# Patient Record
Sex: Male | Born: 1945 | Race: White | Hispanic: No | Marital: Married | Smoking: Never smoker
Health system: Southern US, Community
[De-identification: ages and names within clinical notes are randomized; demographics above are authoritative.]

## PROBLEM LIST (undated history)

## (undated) DIAGNOSIS — G40909 Epilepsy, unspecified, not intractable, without status epilepticus: Secondary | ICD-10-CM

## (undated) DIAGNOSIS — F319 Bipolar disorder, unspecified: Secondary | ICD-10-CM

## (undated) DIAGNOSIS — J4489 Other specified chronic obstructive pulmonary disease: Secondary | ICD-10-CM

## (undated) DIAGNOSIS — F259 Schizoaffective disorder, unspecified: Secondary | ICD-10-CM

## (undated) DIAGNOSIS — R892 Abnormal level of other drugs, medicaments and biological substances in specimens from other organs, systems and tissues: Secondary | ICD-10-CM

## (undated) DIAGNOSIS — J449 Chronic obstructive pulmonary disease, unspecified: Secondary | ICD-10-CM

## (undated) DIAGNOSIS — I80209 Phlebitis and thrombophlebitis of unspecified deep vessels of unspecified lower extremity: Secondary | ICD-10-CM

## (undated) DIAGNOSIS — Q820 Hereditary lymphedema: Secondary | ICD-10-CM

## (undated) DIAGNOSIS — M6281 Muscle weakness (generalized): Secondary | ICD-10-CM

## (undated) DIAGNOSIS — I10 Essential (primary) hypertension: Secondary | ICD-10-CM

## (undated) DIAGNOSIS — E039 Hypothyroidism, unspecified: Secondary | ICD-10-CM

## (undated) HISTORY — DX: Epilepsy, unspecified, not intractable, without status epilepticus: G40.909

## (undated) HISTORY — DX: Essential (primary) hypertension: I10

## (undated) HISTORY — PX: OTHER SURGICAL HISTORY: SHX169

## (undated) HISTORY — DX: Phlebitis and thrombophlebitis of unspecified deep vessels of unspecified lower extremity: I80.209

## (undated) HISTORY — DX: Hereditary lymphedema: Q82.0

## (undated) HISTORY — DX: Hypothyroidism, unspecified: E03.9

## (undated) HISTORY — DX: Bipolar disorder, unspecified: F31.9

## (undated) HISTORY — DX: Schizoaffective disorder, unspecified: F25.9

## (undated) HISTORY — DX: Abnormal level of other drugs, medicaments and biological substances in specimens from other organs, systems and tissues: R89.2

## (undated) HISTORY — DX: Muscle weakness (generalized): M62.81

## (undated) HISTORY — DX: Chronic obstructive pulmonary disease, unspecified: J44.9

## (undated) HISTORY — DX: Other specified chronic obstructive pulmonary disease: J44.89

---

## 2004-09-23 ENCOUNTER — Emergency Department: Payer: Self-pay | Admitting: Emergency Medicine

## 2004-09-23 ENCOUNTER — Other Ambulatory Visit: Payer: Self-pay

## 2004-09-30 ENCOUNTER — Inpatient Hospital Stay: Payer: Self-pay | Admitting: Anesthesiology

## 2007-08-12 ENCOUNTER — Emergency Department: Payer: Self-pay | Admitting: Emergency Medicine

## 2008-02-17 ENCOUNTER — Ambulatory Visit: Payer: Self-pay | Admitting: Family Medicine

## 2009-05-30 ENCOUNTER — Inpatient Hospital Stay: Payer: Self-pay | Admitting: Internal Medicine

## 2009-10-09 ENCOUNTER — Ambulatory Visit: Payer: Self-pay | Admitting: Family Medicine

## 2010-01-13 ENCOUNTER — Ambulatory Visit: Payer: Self-pay | Admitting: Family Medicine

## 2010-02-08 ENCOUNTER — Ambulatory Visit: Payer: Self-pay | Admitting: Family Medicine

## 2010-04-24 ENCOUNTER — Inpatient Hospital Stay: Payer: Self-pay | Admitting: Internal Medicine

## 2010-04-26 LAB — PSA

## 2010-07-12 ENCOUNTER — Emergency Department (HOSPITAL_COMMUNITY): Admission: EM | Admit: 2010-07-12 | Discharge: 2010-07-12 | Payer: Self-pay | Admitting: Emergency Medicine

## 2011-01-03 ENCOUNTER — Emergency Department (HOSPITAL_COMMUNITY): Payer: Medicare Other

## 2011-01-03 ENCOUNTER — Emergency Department (HOSPITAL_COMMUNITY)
Admission: EM | Admit: 2011-01-03 | Discharge: 2011-01-03 | Disposition: A | Discharge status: Home / Self Care | Payer: Medicare Other | Attending: Emergency Medicine | Admitting: Emergency Medicine

## 2011-01-03 DIAGNOSIS — S1093XA Contusion of unspecified part of neck, initial encounter: Secondary | ICD-10-CM | POA: Insufficient documentation

## 2011-01-03 DIAGNOSIS — E039 Hypothyroidism, unspecified: Secondary | ICD-10-CM | POA: Insufficient documentation

## 2011-01-03 DIAGNOSIS — M79609 Pain in unspecified limb: Secondary | ICD-10-CM | POA: Insufficient documentation

## 2011-01-03 DIAGNOSIS — Z8659 Personal history of other mental and behavioral disorders: Secondary | ICD-10-CM | POA: Insufficient documentation

## 2011-01-03 DIAGNOSIS — S0003XA Contusion of scalp, initial encounter: Secondary | ICD-10-CM | POA: Insufficient documentation

## 2011-01-03 DIAGNOSIS — Z7982 Long term (current) use of aspirin: Secondary | ICD-10-CM | POA: Insufficient documentation

## 2011-01-03 DIAGNOSIS — G40909 Epilepsy, unspecified, not intractable, without status epilepticus: Secondary | ICD-10-CM | POA: Insufficient documentation

## 2011-01-03 DIAGNOSIS — R609 Edema, unspecified: Secondary | ICD-10-CM | POA: Insufficient documentation

## 2011-01-03 DIAGNOSIS — I10 Essential (primary) hypertension: Secondary | ICD-10-CM | POA: Insufficient documentation

## 2011-01-03 DIAGNOSIS — S60229A Contusion of unspecified hand, initial encounter: Secondary | ICD-10-CM | POA: Insufficient documentation

## 2011-01-03 DIAGNOSIS — J45909 Unspecified asthma, uncomplicated: Secondary | ICD-10-CM | POA: Insufficient documentation

## 2011-01-03 DIAGNOSIS — F79 Unspecified intellectual disabilities: Secondary | ICD-10-CM | POA: Insufficient documentation

## 2011-01-03 DIAGNOSIS — F319 Bipolar disorder, unspecified: Secondary | ICD-10-CM | POA: Insufficient documentation

## 2011-01-03 DIAGNOSIS — W06XXXA Fall from bed, initial encounter: Secondary | ICD-10-CM | POA: Insufficient documentation

## 2011-01-03 DIAGNOSIS — Z86718 Personal history of other venous thrombosis and embolism: Secondary | ICD-10-CM | POA: Insufficient documentation

## 2011-01-03 DIAGNOSIS — IMO0002 Reserved for concepts with insufficient information to code with codable children: Secondary | ICD-10-CM | POA: Insufficient documentation

## 2011-01-03 DIAGNOSIS — Z79899 Other long term (current) drug therapy: Secondary | ICD-10-CM | POA: Insufficient documentation

## 2011-01-10 ENCOUNTER — Emergency Department (HOSPITAL_COMMUNITY): Payer: Medicare Other

## 2011-01-10 ENCOUNTER — Emergency Department (HOSPITAL_COMMUNITY)
Admission: EM | Admit: 2011-01-10 | Discharge: 2011-01-10 | Disposition: A | Discharge status: Home / Self Care | Payer: Medicare Other | Attending: Emergency Medicine | Admitting: Emergency Medicine

## 2011-01-10 DIAGNOSIS — F79 Unspecified intellectual disabilities: Secondary | ICD-10-CM | POA: Insufficient documentation

## 2011-01-10 DIAGNOSIS — I10 Essential (primary) hypertension: Secondary | ICD-10-CM | POA: Insufficient documentation

## 2011-01-10 DIAGNOSIS — IMO0002 Reserved for concepts with insufficient information to code with codable children: Secondary | ICD-10-CM | POA: Insufficient documentation

## 2011-01-10 DIAGNOSIS — G40909 Epilepsy, unspecified, not intractable, without status epilepticus: Secondary | ICD-10-CM | POA: Insufficient documentation

## 2011-01-10 DIAGNOSIS — E039 Hypothyroidism, unspecified: Secondary | ICD-10-CM | POA: Insufficient documentation

## 2011-01-10 DIAGNOSIS — W19XXXA Unspecified fall, initial encounter: Secondary | ICD-10-CM | POA: Insufficient documentation

## 2011-01-10 DIAGNOSIS — I890 Lymphedema, not elsewhere classified: Secondary | ICD-10-CM | POA: Insufficient documentation

## 2011-01-10 DIAGNOSIS — S0100XA Unspecified open wound of scalp, initial encounter: Secondary | ICD-10-CM | POA: Insufficient documentation

## 2011-01-10 DIAGNOSIS — J45909 Unspecified asthma, uncomplicated: Secondary | ICD-10-CM | POA: Insufficient documentation

## 2011-01-10 DIAGNOSIS — Y9210 Unspecified residential institution as the place of occurrence of the external cause: Secondary | ICD-10-CM | POA: Insufficient documentation

## 2011-01-10 DIAGNOSIS — Z7982 Long term (current) use of aspirin: Secondary | ICD-10-CM | POA: Insufficient documentation

## 2011-01-10 DIAGNOSIS — S0003XA Contusion of scalp, initial encounter: Secondary | ICD-10-CM | POA: Insufficient documentation

## 2011-01-10 DIAGNOSIS — Z8659 Personal history of other mental and behavioral disorders: Secondary | ICD-10-CM | POA: Insufficient documentation

## 2011-01-10 DIAGNOSIS — S1093XA Contusion of unspecified part of neck, initial encounter: Secondary | ICD-10-CM | POA: Insufficient documentation

## 2011-01-10 DIAGNOSIS — Z79899 Other long term (current) drug therapy: Secondary | ICD-10-CM | POA: Insufficient documentation

## 2011-01-10 DIAGNOSIS — F319 Bipolar disorder, unspecified: Secondary | ICD-10-CM | POA: Insufficient documentation

## 2011-01-10 DIAGNOSIS — Z86718 Personal history of other venous thrombosis and embolism: Secondary | ICD-10-CM | POA: Insufficient documentation

## 2011-01-10 LAB — BASIC METABOLIC PANEL
Calcium: 8.7 mg/dL (ref 8.4–10.5)
Chloride: 104 mEq/L (ref 96–112)
Creatinine, Ser: 0.94 mg/dL (ref 0.4–1.5)
GFR calc Af Amer: >60 mL/min (ref >60)
GFR calc non Af Amer: >60 mL/min (ref >60)

## 2011-01-10 LAB — PHENYTOIN LEVEL, TOTAL: Phenytoin Lvl: 29.6 ug/mL — ABNORMAL HIGH (ref 10.0–20.0)

## 2011-01-10 LAB — CBC
MCH: 29.6 pg (ref 26.0–34.0)
Platelets: 315 10*3/uL (ref 150–400)
RBC: 6.11 MIL/uL — ABNORMAL HIGH (ref 4.22–5.81)
RDW: 15 % (ref 11.5–15.5)
WBC: 8.3 10*3/uL (ref 4.0–10.5)

## 2011-01-10 LAB — DIFFERENTIAL
Basophils Relative: 1 % (ref 0–1)
Eosinophils Absolute: 0.4 10*3/uL (ref 0.0–0.7)
Eosinophils Relative: 4 % (ref 0–5)
Monocytes Relative: 5 % (ref 3–12)
Neutrophils Relative %: 70 % (ref 43–77)

## 2011-01-10 LAB — VALPROIC ACID LEVEL: Valproic Acid Lvl: 32.7 ug/mL — ABNORMAL LOW (ref 50.0–100.0)

## 2011-12-15 ENCOUNTER — Encounter: Payer: Self-pay | Admitting: Gastroenterology

## 2012-01-07 ENCOUNTER — Ambulatory Visit: Payer: Medicare Other | Admitting: Gastroenterology

## 2012-01-30 ENCOUNTER — Ambulatory Visit: Payer: Medicare Other | Admitting: Gastroenterology

## 2012-12-02 ENCOUNTER — Other Ambulatory Visit: Payer: Self-pay | Admitting: Internal Medicine

## 2012-12-02 DIAGNOSIS — R479 Unspecified speech disturbances: Secondary | ICD-10-CM

## 2012-12-03 ENCOUNTER — Ambulatory Visit (HOSPITAL_COMMUNITY)
Admission: RE | Admit: 2012-12-03 | Discharge: 2012-12-03 | Disposition: A | Discharge status: Home / Self Care | Payer: Medicare Other | Source: Ambulatory Visit | Attending: Internal Medicine | Admitting: Internal Medicine

## 2012-12-03 DIAGNOSIS — R479 Unspecified speech disturbances: Secondary | ICD-10-CM

## 2012-12-03 DIAGNOSIS — R4789 Other speech disturbances: Secondary | ICD-10-CM | POA: Insufficient documentation

## 2012-12-03 DIAGNOSIS — I6789 Other cerebrovascular disease: Secondary | ICD-10-CM | POA: Insufficient documentation

## 2013-01-07 ENCOUNTER — Non-Acute Institutional Stay: Payer: Medicare Other | Admitting: Internal Medicine

## 2013-01-07 DIAGNOSIS — I890 Lymphedema, not elsewhere classified: Secondary | ICD-10-CM

## 2013-01-07 DIAGNOSIS — F209 Schizophrenia, unspecified: Secondary | ICD-10-CM

## 2013-01-07 DIAGNOSIS — E039 Hypothyroidism, unspecified: Secondary | ICD-10-CM

## 2013-01-07 DIAGNOSIS — R569 Unspecified convulsions: Secondary | ICD-10-CM

## 2013-01-27 NOTE — Progress Notes (Signed)
Patient ID: Kristopher Pitts, male   DOB: August 01, 1946, 67 y.o.   MRN: [id number]        PROGRESS NOTE  DATE:  01/07/2013  FACILITY: [facility]   LEVEL OF CARE: Assisted Living   Routine Visit  CHIEF COMPLAINT:  Manage lymphedema, hypothyroidism and seizure disorder.    HISTORY OF PRESENT ILLNESS:  REASSESSMENT OF ONGOING PROBLEM(S):  CHRONIC LYMPHEDEMA:  The patient has chronic lymphedema of bilateral lower extremities, left greater than right, which is hereditary edema.  He is overall a poor historian.  Staff do not report increasing lower extremity swelling.    HYPOTHYROIDISM: The hypothyroidism remains stable. No complications noted from the medications presently being used.  The staff denies fatigue or constipation.  Last TSH:  1.91 in 10/2012.    SEIZURE DISORDER: The patient's seizure disorder remains stable. No complications reported from the medications presently being used. Staff do not report any recent seizure activity.   PAST MEDICAL HISTORY : Reviewed.  No changes.  CURRENT MEDICATIONS: Reviewed per [facility]  REVIEW OF SYSTEMS:  Difficult to obtain secondary to mental retardation.    PHYSICAL EXAMINATION  VS:  T 98.1      P  80    RR  20    BP 138/64    POX %     WT (Lb) 232  GENERAL: no acute distress, moderately obese body habitus EYES: conjunctivae normal, sclerae normal, normal eye lids NECK: supple, trachea midline, no neck masses, no thyroid tenderness, no thyromegaly LYMPHATICS: no LAN in the neck, no supraclavicular LAN RESPIRATORY: breathing is even & unlabored, BS CTAB CARDIAC: RRR, no murmur,no extra heart sounds EDEMA/VARICOSITIES:  left lower extremity has +6 edema, right lower extremity has +3 edema ARTERIAL:  pedal pulses nonpalpable  GI: abdomen soft, normal BS, no masses, no tenderness, no hepatomegaly, no splenomegaly PSYCHIATRIC: the patient is alert & oriented to person, affect & behavior appropriate  LABS/RADIOLOGY: 12/2012:  CBC normal.    Albumin 3.3.    Dilantin level 7.    11/2012:   TSH 2.2.    10/2012:  Total protein 5.9, albumin 3.4, otherwise CMP normal.    Hemoglobin A1C 5.1.    Depakote level 22.    09/2012:  Fasting lipid panel normal.  ASSESSMENT/PLAN:  Chronic lymphedema.  This is hereditary.  Symptoms are stable.  Due to hereditary edema, muscle weakness and history of falls, he requires a wheelchair.   He has mobility limitations that significantly impair his ability to participate in activities of daily living such as feeding, dressing, grooming and bathing in customary locations in the assisted living facility.  He had expressed to use the wheelchair and has sufficient upper body strength to self-propel the wheelchair.  He would benefit from having a manual wheelchair to perform ADLs.    Hypothyroidism.  Well controlled.     Hypertension.  Well controlled.   Schizophrenia.  Stable.   Seizure disorder.  Well controlled.   BPH.  No issues reported by the staff.    Left lower extremity DVT.  Patient was started on Xarelto.    Hypokalemia.  Well repleted.  Continue supplementation.     CPT CODE: 44034

## 2013-04-06 ENCOUNTER — Non-Acute Institutional Stay: Payer: Medicare Other | Admitting: Internal Medicine

## 2013-04-06 DIAGNOSIS — F209 Schizophrenia, unspecified: Secondary | ICD-10-CM

## 2013-04-06 DIAGNOSIS — E039 Hypothyroidism, unspecified: Secondary | ICD-10-CM

## 2013-04-06 DIAGNOSIS — I10 Essential (primary) hypertension: Secondary | ICD-10-CM

## 2013-04-06 DIAGNOSIS — I890 Lymphedema, not elsewhere classified: Secondary | ICD-10-CM

## 2013-04-08 DIAGNOSIS — F209 Schizophrenia, unspecified: Secondary | ICD-10-CM | POA: Insufficient documentation

## 2013-04-08 DIAGNOSIS — I890 Lymphedema, not elsewhere classified: Secondary | ICD-10-CM | POA: Insufficient documentation

## 2013-04-08 DIAGNOSIS — I10 Essential (primary) hypertension: Secondary | ICD-10-CM | POA: Insufficient documentation

## 2013-04-08 DIAGNOSIS — E039 Hypothyroidism, unspecified: Secondary | ICD-10-CM | POA: Insufficient documentation

## 2013-04-08 NOTE — Progress Notes (Signed)
Patient ID: Kristopher Pitts, male   DOB: 14-May-1946, 67 y.o.   MRN: [id number]        PROGRESS NOTE  DATE:  04/06/2013  FACILITY: [facility]   LEVEL OF CARE: Assisted Living   Routine Visit  CHIEF COMPLAINT:  Manage lymphedema, hypothyroidism and seizure disorder.    HISTORY OF PRESENT ILLNESS:  REASSESSMENT OF ONGOING PROBLEM(S):  CHRONIC LYMPHEDEMA:  The patient has chronic lymphedema of bilateral lower extremities, left greater than right, which is hereditary edema.  He is overall a poor historian.  Staff do not report increasing lower extremity swelling.    HYPOTHYROIDISM: The hypothyroidism remains stable. No complications noted from the medications presently being used.  The staff denies fatigue or constipation.  Last TSH:  1.91 in 10/2012.    SEIZURE DISORDER: The patient's seizure disorder remains stable. No complications reported from the medications presently being used. Staff do not report any recent seizure activity.   PAST MEDICAL HISTORY : Reviewed.  No changes.  CURRENT MEDICATIONS: Reviewed per [facility]  REVIEW OF SYSTEMS:  Difficult to obtain secondary to mental retardation.    PHYSICAL EXAMINATION  VS:  T 98.4      P  82    RR  20    BP 108/66    POX %     WT (Lb) 240  GENERAL: no acute distress, moderately obese body habitus EYES: conjunctivae normal, sclerae normal, normal eye lids NECK: supple, trachea midline, no neck masses, no thyroid tenderness, no thyromegaly LYMPHATICS: no LAN in the neck, no supraclavicular LAN RESPIRATORY: breathing is even & unlabored, BS CTAB CARDIAC: RRR, no murmur,no extra heart sounds EDEMA/VARICOSITIES:  left lower extremity has +6 edema, right lower extremity has +2 edema ARTERIAL:  pedal pulses nonpalpable  GI: abdomen soft, normal BS, no masses, no tenderness, no hepatomegaly, no splenomegaly PSYCHIATRIC: the patient is alert & oriented to person, affect & behavior appropriate  LABS/RADIOLOGY:  6/14 alb 3.4, dilantin level  9.4  12/2012:  CBC normal.   Albumin 3.3.    Dilantin level 7.    11/2012:   TSH 2.2.    10/2012:  Total protein 5.9, albumin 3.4, otherwise CMP normal.    Hemoglobin A1C 5.1.    Depakote level 22.    09/2012:  Fasting lipid panel normal.  ASSESSMENT/PLAN:  Chronic lymphedema.  This is hereditary.  Symptoms are stable.    Hypothyroidism.  Well controlled.     Hypertension.  Well controlled.   Schizophrenia.  Stable.   Seizure disorder.  Well controlled.   BPH.  No issues reported by the staff.    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    Hypokalemia.  Well repleted.  Continue supplementation.    CPT CODE: 40981

## 2013-04-26 ENCOUNTER — Other Ambulatory Visit: Payer: Self-pay | Admitting: Geriatric Medicine

## 2013-04-26 MED ORDER — CLONAZEPAM 0.5 MG PO TABS: 0.5000 mg | ORAL_TABLET | Freq: Three times a day (TID) | ORAL | Status: DC | PRN

## 2013-06-03 ENCOUNTER — Non-Acute Institutional Stay: Payer: Medicare Other | Admitting: Internal Medicine

## 2013-06-03 DIAGNOSIS — H109 Unspecified conjunctivitis: Secondary | ICD-10-CM

## 2013-06-21 ENCOUNTER — Other Ambulatory Visit: Payer: Self-pay

## 2013-06-21 MED ORDER — AMBULATORY NON FORMULARY MEDICATION: Status: AC

## 2013-06-21 NOTE — Telephone Encounter (Signed)
I called the pharmacy [facility] to see how medication is dispense- per Fleet Contras rx should be written to be dispensed as syringes ie-30 syringes with allowed refills   Verified dose and instructions reflect manual request received by nursing home.

## 2013-06-29 DIAGNOSIS — H109 Unspecified conjunctivitis: Secondary | ICD-10-CM | POA: Insufficient documentation

## 2013-06-29 NOTE — Progress Notes (Signed)
Patient ID: Kristopher Pitts, male   DOB: [birth year], 67 y.o.   MRN: [id number]        PROGRESS NOTE  DATE: 06/03/2013  FACILITY:  [facility]  LEVEL OF CARE: ALF (13)  Acute Visit  CHIEF COMPLAINT:  Manage bilateral eye redness and discharge.    HISTORY OF PRESENT ILLNESS: I was requested by the staff to assess the patient regarding above problem(s):  Staff report that patient is having bilateral eye redness and drainage for several days.  Patient overall is a poor historian.  Staff cannot identify precipitating or alleviating factors.  There is no temporal relationship.    PAST MEDICAL HISTORY : Reviewed.  No changes.  CURRENT MEDICATIONS: Reviewed per [facility]  REVIEW OF SYSTEMS:  Unobtainable due to patient being a poor historian.    PHYSICAL EXAMINATION  VS:  T 98.1       P 64      RR 20      BP 130/72     POX %       WT (Lb)  GENERAL: no acute distress, morbidly obese body habitus EYES: right eye has extreme conjunctival erythema and a yellow discharge, left eye also has symptoms but to a lesser degree NECK: supple, trachea midline, no neck masses, no thyroid tenderness, no thyromegaly LYMPHATICS: no LAN in the neck, no supraclavicular LAN RESPIRATORY: breathing is even & unlabored, BS CTAB  ASSESSMENT/PLAN:  Bilateral conjunctivitis.  New problem.  Start gentamicin opthalmic 0.3%, 2 drops to each eye b.i.d. for one week.    CPT CODE: 40981

## 2013-08-11 ENCOUNTER — Non-Acute Institutional Stay: Payer: Medicare Other | Admitting: Internal Medicine

## 2013-08-11 DIAGNOSIS — J189 Pneumonia, unspecified organism: Secondary | ICD-10-CM

## 2013-08-18 ENCOUNTER — Encounter: Payer: Self-pay | Admitting: Internal Medicine

## 2013-08-18 ENCOUNTER — Non-Acute Institutional Stay: Payer: Medicare Other | Admitting: Internal Medicine

## 2013-08-18 DIAGNOSIS — I10 Essential (primary) hypertension: Secondary | ICD-10-CM

## 2013-08-18 DIAGNOSIS — R569 Unspecified convulsions: Secondary | ICD-10-CM

## 2013-08-18 DIAGNOSIS — I890 Lymphedema, not elsewhere classified: Secondary | ICD-10-CM

## 2013-08-18 DIAGNOSIS — E039 Hypothyroidism, unspecified: Secondary | ICD-10-CM

## 2013-08-18 NOTE — Progress Notes (Signed)
Patient ID: Kristopher Pitts, male   DOB: 1946-01-21, 67 y.o.   MRN: [id number]        PROGRESS NOTE  DATE:  08/18/2013  FACILITY: [facility]   LEVEL OF CARE: Assisted Living   Routine Visit  CHIEF COMPLAINT:  Manage lymphedema, hypothyroidism and seizure disorder.    HISTORY OF PRESENT ILLNESS:  REASSESSMENT OF ONGOING PROBLEM(S):  CHRONIC LYMPHEDEMA:  The patient has chronic lymphedema of bilateral lower extremities, left greater than right, which is hereditary edema.  He is overall a poor historian.  Staff do not report increasing lower extremity swelling.    HYPOTHYROIDISM: The hypothyroidism remains stable. No complications noted from the medications presently being used.  The staff denies fatigue or constipation.  Last TSH:  1.91 in 10/2012, in 8-14 TSH 1.1.    SEIZURE DISORDER: The patient's seizure disorder remains stable. No complications reported from the medications presently being used. Staff do not report any recent seizure activity.   PAST MEDICAL HISTORY : Reviewed.  No changes.  CURRENT MEDICATIONS: Reviewed per [facility]  REVIEW OF SYSTEMS:  Difficult to obtain secondary to mental retardation.    PHYSICAL EXAMINATION  VS:  T 97.3      P  94    RR  20    BP 128/94    POX %     WT (Lb) 232  GENERAL: no acute distress, moderately obese body habitus EYES: conjunctivae normal, sclerae normal, normal eye lids NECK: supple, trachea midline, no neck masses, no thyroid tenderness, no thyromegaly LYMPHATICS: no LAN in the neck, no supraclavicular LAN RESPIRATORY: breathing is even & unlabored, BS CTAB CARDIAC: RRR, no murmur,no extra heart sounds EDEMA/VARICOSITIES:  left lower extremity has +6 edema, right lower extremity has +2 edema ARTERIAL:  pedal pulses nonpalpable  GI: abdomen soft, normal BS, no masses, no tenderness, no hepatomegaly, no splenomegaly PSYCHIATRIC: the patient is alert & oriented to person, affect & behavior appropriate  LABS/RADIOLOGY:  11-14 CBC  normal 8-14 total protein 5.7, albumin 3.1 otherwise CMP normal, Depakote level 63, Dilantin level 7.6 7-14 HDL 34 otherwise fasting lipid panel normal  6/14 alb 3.4, dilantin level 9.4  12/2012:  CBC normal.   Albumin 3.3.    Dilantin level 7.    11/2012:   TSH 2.2.    10/2012:  Total protein 5.9, albumin 3.4, otherwise CMP normal.    Hemoglobin A1C 5.1.    Depakote level 22.    09/2012:  Fasting lipid panel normal.  ASSESSMENT/PLAN:  Chronic lymphedema.  This is hereditary.  Symptoms are stable.    Hypothyroidism.  Well controlled.     Hypertension.  Well controlled.   Schizophrenia.  Stable.   Seizure disorder.  Well controlled.   BPH.  No issues reported by the staff.    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    Hypokalemia.  Well repleted.  Continue supplementation.    Pneumonia-completed augmentin.  CPT CODE: 14782

## 2013-09-23 ENCOUNTER — Other Ambulatory Visit (HOSPITAL_COMMUNITY): Payer: Self-pay | Admitting: Internal Medicine

## 2013-09-23 DIAGNOSIS — R1310 Dysphagia, unspecified: Secondary | ICD-10-CM

## 2013-09-26 ENCOUNTER — Encounter: Payer: Self-pay | Admitting: Internal Medicine

## 2013-09-26 DIAGNOSIS — J189 Pneumonia, unspecified organism: Secondary | ICD-10-CM | POA: Insufficient documentation

## 2013-09-26 NOTE — Progress Notes (Addendum)
Patient ID: Kristopher Pitts, male   DOB: 1946/04/06, 67 y.o.   MRN: [id number]        PROGRESS NOTE  DATE: 08/11/2013    FACILITY:  [facility]  LEVEL OF CARE: ALF (13)  Acute Visit  CHIEF COMPLAINT:  Manage pneumonia.    HISTORY OF PRESENT ILLNESS: I was requested by the staff to assess the patient regarding above problem(s):  Due to coughing, patient had a chest x-ray done on 08/08/2013 which showed acute right base infiltrate.  Patient is a poor historian.  Staff do not report respiratory insufficiency or fever.    PAST MEDICAL HISTORY : Reviewed.  No changes.  CURRENT MEDICATIONS: Reviewed per [facility]  REVIEW OF SYSTEMS:  Unobtainable.  Patient is a poor historian.      PHYSICAL EXAMINATION  GENERAL: no acute distress, morbidly obese body habitus EYES: conjunctivae normal, sclerae normal, normal eye lids NECK: supple, trachea midline, no neck masses, no thyroid tenderness, no thyromegaly LYMPHATICS: no LAN in the neck, no supraclavicular LAN RESPIRATORY: breathing is even & unlabored, BS CTAB CARDIAC: RRR, no murmur,no extra heart sounds EDEMA/VARICOSITIES: left lower extremity has +4 edema, right lower extremity has +2 edema   ARTERIAL: pedal pulses nonpalpable   GI: abdomen soft, normal BS, no masses, no tenderness, no hepatomegaly, no splenomegaly PSYCHIATRIC: the patient is alert, disoriented, affect & behavior appropriate  ASSESSMENT/PLAN:  Right basilar pneumonia.  New onset.  Significant problem.   Augmentin 500 mg b.i.d. for seven days and probiotics b.i.d. for 10 days started.    THN Metrics:   Currently not on aspirin due to being on Xarelto.  Current non smoker.   CPT CODE: 08657

## 2013-09-27 ENCOUNTER — Ambulatory Visit (HOSPITAL_COMMUNITY)
Admission: RE | Admit: 2013-09-27 | Discharge: 2013-09-27 | Disposition: A | Discharge status: Home / Self Care | Payer: Medicare Other | Source: Ambulatory Visit | Attending: Internal Medicine | Admitting: Internal Medicine

## 2013-09-27 ENCOUNTER — Ambulatory Visit (HOSPITAL_COMMUNITY): Payer: Medicare Other

## 2013-09-27 DIAGNOSIS — R1310 Dysphagia, unspecified: Secondary | ICD-10-CM

## 2013-09-27 NOTE — Procedures (Signed)
Objective Swallowing Evaluation: Modified Barium Swallowing Study  Patient Details  Name: Kristopher Pitts MRN: [id number] Date of Birth: 08/19/1946  Today's Date: 09/27/2013 Time: 1330-1401 SLP Time Calculation (min): 31 min  Past Medical History:  Past Medical History  Diagnosis Date  . Unspecified essential hypertension   . Unspecified hypothyroidism   . Unspecified epilepsy without mention of intractable epilepsy   . Bipolar disorder, unspecified   . Schizoaffective disorder, unspecified condition   . Chronic airway obstruction, not elsewhere classified   . Nonspecific abnormal toxicological findings   . Muscle weakness (generalized)   . Hereditary edema of legs   . Deep vein thrombophlebitis of leg, unspecified laterality   . Asthma    Past Surgical History: No past surgical history on file. HPI:  67 yo male referred by SNF SLP for MBSS.  Pt PMH + for Mental Retardation, DVT, HTN, seizures, Asthma/COPD, Hypothyroidism, lymphadema and Dysphagia.  Per SNF SLP, pt has been coughing with intake, use of sippy cup better tolerated.  Pna reported approx six weeks ago but pt has maintained his weight.   Medication list includes Lasix, Topomax, Flomax, Xarelto, Cogentin, Synthroid, Depakote, Dilantin, Iloperidone and Prolixin.  Pt required use of lift for transfer during MBS and is wheelchair bound.       Assessment / Plan / Recommendation Clinical Impression  Dysphagia Diagnosis: Moderate oral phase dysphagia;Moderate pharyngeal phase dysphagia  Clinical impression: Limited evaluation due to pt's broad shoulders impacting view.  Moderate oropharyngeal dysphagia with both sensorimotor deficits apparent.   Pt noted to drool prior to po administration, suspect due to ? CN deficit, accessory muscle nerve impact (decreased ability to hold head up) and curvature of spine.    Impaired oral bolus cohesion/coordination present resulting in delayed oral transiting and premature spillage of barium  into pharynx as well as mild oral residuals.  Pharyngeal swallow was intermittently delayed with decreased laryngeal elevation/closure allowing penetration/aspiration of liquids.   Pharyngeal swallow was strong with only trace residuals but unfortunately pt with laryngeal penetration/aspiration of trace amounts of stasis/secretions.    Pt did not follow commands consistently to dry swallow or cough, which will raise his aspiration risk.   Use of pt's sippy cup was limited (but used throughout testing) due to large size of cup and limited amount of barium able to use and decreased ability for pt to lift cup adequately.  Trace aspiration of thin noted during the swallow, as well as laryngeal penetration/aspiration of nectar mixed with secretion after the swallow.  Defer to primary SLP to determine liquid consistency, ? if pt could produce occasional strong cough and dry swallow on cue and all vitals stable/pt tolerates po diet, continuing thin liquids may be beneficial.  Skilled intervention included educating pt to precautions and reinforcing beneficial strategies.  Please note, pt coughed x3 during MBS, SLP could not attribute to aspiration, unless was a delayed response.   Also question if appearance of delayed clearance of esophagus contributes to pt's dysphagia symptoms.     Thanks for this consult.      Treatment Recommendation  Defer treatment plan to SLP at (Comment) (SNF)    Diet Recommendation Dysphagia 3 (Mechanical Soft);Thin liquid;Nectar-thick liquid (thin vs nectar, defer to SNF SLP/MD)   Medication Administration: Whole meds with puree Supervision: Full supervision/cueing for compensatory strategies Compensations: Hard cough after swallow;Multiple dry swallows after each bite/sip;Slow rate;Small sips/bites (encourage intermittent dry swallow and cough) Postural Changes and/or Swallow Maneuvers: Upright 30-60 min after meal;Seated upright 90 degrees  Other  Recommendations Oral Care  Recommendations: Oral care BID              General Date of Onset: 09/27/13 HPI: 67 yo male referred by SNF SLP for MBSS.  Pt PMH + for Mental Retardation, DVT, HTN, seizures, Asthma/COPD, Hypothyroidism, lymphadema and Dysphagia.  Per SNF SLP, pt has been coughing with intake, use of sippy cup better tolerated.  Pna reported approx six weeks ago but pt has maintained his weight.   Medication list includes Lasix, Topomax, Flomax, Xarelto, Cogentin, Synthroid, Depakote, Dilantin, Iloperidone and Prolixin.  Pt required use of lift for transfer during MBS and is wheelchair bound.   Type of Study: Modified Barium Swallowing Study Reason for Referral: Objectively evaluate swallowing function Previous Swallow Assessment: cervical spine 2011  There is significant disc height loss at C5-6, with anterior bridging, and partial ankylosis of C5-C6.  There is also disc height narrowing at C3-4, C4-5, and C6-7. Diet Prior to this Study: Dysphagia 3 (soft);Thin liquids (pt has been using sippy cup) Temperature Spikes Noted: No Respiratory Status: Room air History of Recent Intubation: No Behavior/Cognition: Alert Oral Cavity - Dentition: Missing dentition Oral Motor / Sensory Function:  (limited assessment due to decreased abilitly to follow directions, pt drooling and leaning head down ? accessory nerve involvement exacerbated by kyphosis, dysarthria) Self-Feeding Abilities: Needs assist Patient Positioning: Postural control interferes with function (pt kyphotic impacting swallow, difficult to position ) Baseline Vocal Quality: Clear Volitional Cough: Weak Volitional Swallow: Unable to elicit Anatomy:  (except kyphosis, appearance of cervical osteophyte at  c4-c5 impinging into pharynx) Pharyngeal Secretions: Standing secretions in (comment) (pharynx that mixed with barium)    Reason for Referral Objectively evaluate swallowing function   Oral Phase Oral Preparation/Oral Phase Oral Phase:  Impaired Oral - Nectar Oral - Nectar Teaspoon: Weak lingual manipulation;Delayed oral transit;Reduced posterior propulsion Oral - Nectar Cup: Weak lingual manipulation;Delayed oral transit;Reduced posterior propulsion Oral - Thin Oral - Thin Teaspoon: Weak lingual manipulation;Delayed oral transit;Reduced posterior propulsion Oral - Thin Cup: Weak lingual manipulation;Delayed oral transit;Reduced posterior propulsion Oral - Solids Oral - Puree: Weak lingual manipulation;Delayed oral transit;Reduced posterior propulsion Oral - Regular: Weak lingual manipulation;Delayed oral transit;Reduced posterior propulsion Oral Phase - Comment Oral Phase - Comment: pt with premature spillage of barium into pharynx with poor control due to weakness, pt did not consistently follow directions to dry swallow, etc, drooling noted with pt having difficulty keeping head neutral during testing - suspect kyphosis contributes   Pharyngeal Phase Pharyngeal Phase Pharyngeal Phase: Impaired Pharyngeal - Nectar Pharyngeal - Nectar Teaspoon: Premature spillage to pyriform sinuses;Delayed swallow initiation;Reduced anterior laryngeal mobility;Reduced laryngeal elevation;Reduced airway/laryngeal closure;Penetration/Aspiration during swallow Penetration/Aspiration details (nectar teaspoon): Material enters airway, remains ABOVE vocal cords then ejected out Pharyngeal - Nectar Cup: Premature spillage to pyriform sinuses;Premature spillage to valleculae;Delayed swallow initiation;Trace aspiration;Penetration/Aspiration during swallow;Reduced airway/laryngeal closure;Reduced laryngeal elevation;Reduced anterior laryngeal mobility Penetration/Aspiration details (nectar cup): Material enters airway, passes BELOW cords without attempt by patient to eject out (silent aspiration) Pharyngeal - Thin Pharyngeal - Thin Teaspoon: Reduced airway/laryngeal closure;Reduced laryngeal elevation;Reduced anterior laryngeal  mobility;Penetration/Aspiration during swallow;Trace aspiration;Premature spillage to pyriform sinuses Penetration/Aspiration details (thin teaspoon): Material enters airway, CONTACTS cords then ejected out Pharyngeal - Thin Cup: Premature spillage to pyriform sinuses;Penetration/Aspiration during swallow;Reduced anterior laryngeal mobility;Reduced laryngeal elevation;Reduced airway/laryngeal closure Penetration/Aspiration details (thin cup): Material enters airway, passes BELOW cords without attempt by patient to eject out (silent aspiration) Pharyngeal - Solids Pharyngeal - Puree: Premature spillage to valleculae Pharyngeal - Regular: Premature  spillage to valleculae Pharyngeal Phase - Comment Pharyngeal Comment: mild pharyngeal stasis noted, decreased laryngeal elevation/closure, barium mixed with oropharyngeal secretions   Cervical Esophageal Phase    GO    Cervical Esophageal Phase Cervical Esophageal Phase: Impaired Cervical Esophageal Phase - Comment Cervical Esophageal Comment: appearance of kyphosis likely contributes to delayed esophageal clearance, pt appeared with stasis across consistencies at distal esophagus without sensation, appearance of backflow of liquids given to aid solid/puree clearance without pt sensation, backflow appeared to mid-esophagus that may be consistent with motility issues, radiologist not present to confirm findings    Functional Assessment Tool Used: MBS, clinical judgement Functional Limitations: Swallowing Swallow Current Status (J4782): At least 20 percent but less than 40 percent impaired, limited or restricted Swallow Goal Status [phone]): At least 20 percent but less than 40 percent impaired, limited or restricted Swallow Discharge Status [id number]): At least 20 percent but less than 40 percent impaired, limited or restricted    Donavan Burnet, MS [facility] S[facility] [phone]

## 2013-10-03 ENCOUNTER — Non-Acute Institutional Stay (SKILLED_NURSING_FACILITY): Payer: Medicare Other | Admitting: Family

## 2013-10-03 DIAGNOSIS — I10 Essential (primary) hypertension: Secondary | ICD-10-CM

## 2013-10-03 DIAGNOSIS — R1310 Dysphagia, unspecified: Secondary | ICD-10-CM

## 2013-10-03 DIAGNOSIS — F259 Schizoaffective disorder, unspecified: Secondary | ICD-10-CM

## 2013-10-03 DIAGNOSIS — E039 Hypothyroidism, unspecified: Secondary | ICD-10-CM

## 2013-10-04 ENCOUNTER — Encounter: Payer: Self-pay | Admitting: Family

## 2013-10-04 NOTE — Progress Notes (Signed)
Patient ID: Kristopher Pitts, male   DOB: 08/30/1946, 68 y.o.   MRN: [id number]  Date: 10/03/13 [facility]: [facility]   Chief Complaint  Patient presents with  . Medical Managment of Chronic Issues    Routine Visit      HPI: Pt is being followed for the medical management of chronic illnesses. Health care team denies concerns/issues at present.       Allergies  Allergen Reactions  . Coumadin [Warfarin Sodium]   . Lithium      Medication List       This list is accurate as of: 10/03/13 11:59 PM.  Always use your most recent med list.               AMBULATORY NON FORMULARY MEDICATION  Medication Name: Lorazepam 1 mg /ML gel, apply 1 ML topically every 8 hours as needed     benztropine 0.5 MG tablet  Commonly known as:  COGENTIN  Take 0.5 mg by mouth daily.     clonazePAM 0.5 MG tablet  Commonly known as:  KLONOPIN  Take 1 tablet (0.5 mg total) by mouth 3 (three) times daily as needed for anxiety.     divalproex 250 MG DR tablet  Commonly known as:  DEPAKOTE  Take 750 mg by mouth 2 (two) times daily.     FANAPT 10 MG Tabs  Generic drug:  Iloperidone  Take 1 tablet by mouth daily.     finasteride 5 MG tablet  Commonly known as:  PROSCAR  Take 5 mg by mouth daily.     fluPHENAZine 10 MG tablet  Commonly known as:  PROLIXIN  Take 10 mg by mouth 2 (two) times daily.     furosemide 20 MG tablet  Commonly known as:  LASIX  Take 20 mg by mouth 3 (three) times daily.     levothyroxine 75 MCG tablet  Commonly known as:  SYNTHROID, LEVOTHROID  Take 37.5 mcg by mouth daily.     olanzapine zydis 15 MG disintegrating tablet  Commonly known as:  ZYPREXA  Take 15 mg by mouth at bedtime.     oxybutynin 5 MG tablet  Commonly known as:  DITROPAN  Take 5 mg by mouth daily.     phenytoin 100 MG ER capsule  Commonly known as:  DILANTIN  Take 100 mg by mouth 2 (two) times daily.     potassium chloride 10 MEQ tablet  Commonly known as:  K-DUR,KLOR-CON  Take 10  mEq by mouth daily.     tamsulosin 0.4 MG Caps capsule  Commonly known as:  FLOMAX  Take 0.4 mg by mouth daily.     topiramate 100 MG tablet  Commonly known as:  TOPAMAX  Take 100 mg by mouth daily.         DATA REVIEWED    Laboratory Studies: Reviewed      Past Medical History  Diagnosis Date  . Unspecified essential hypertension   . Unspecified hypothyroidism   . Unspecified epilepsy without mention of intractable epilepsy   . Bipolar disorder, unspecified   . Schizoaffective disorder, unspecified condition   . Chronic airway obstruction, not elsewhere classified   . Nonspecific abnormal toxicological findings   . Muscle weakness (generalized)   . Hereditary edema of legs   . Deep vein thrombophlebitis of leg, unspecified laterality   . Asthma      Review of Systems  Unable to perform ROS: other  Garbled Speech  Physical Exam Filed Vitals:  10/03/13 1500  BP: 120/74  Pulse: 76  Temp: 97 F (36.1 C)  Resp: 20   There is no height or weight on file to calculate BMI. Physical Exam  Constitutional:  Pt is dressed and groomed appropriated in facility's bed.  Eyes:    Cardiovascular: Normal rate, regular rhythm and normal heart sounds.   Pulmonary/Chest: Effort normal and breath sounds normal.  Abdominal: Soft. Bowel sounds are normal.    ASSESSMENT/PLAN  Conjunctivitis-Pt was placed on Bacitracin/Polymyxin opthalmic ointment for conjunctivitis in addition to order for visual acuity and hand hygiene every shift x 7 days Dysphagia-Referral for neurology r/t acute onset of dysphagia Obtain CBC, BMP, Vitamin D, Vitamin B12, Folate, TSH and free T$ Hypertension-will continue on current treatment plan; pt is stable Hypothyroidism-will order labs to monitor thyroid function Schizoaffective Disorder-will continue will treatment plan; pt is stable Seizures-will continue with current treatment plan; pt is stable     Follow up:prn

## 2013-10-06 ENCOUNTER — Non-Acute Institutional Stay: Payer: Medicare Other | Admitting: Internal Medicine

## 2013-10-06 DIAGNOSIS — R05 Cough: Secondary | ICD-10-CM

## 2013-10-06 DIAGNOSIS — R059 Cough, unspecified: Secondary | ICD-10-CM

## 2013-10-07 DIAGNOSIS — R059 Cough, unspecified: Secondary | ICD-10-CM | POA: Insufficient documentation

## 2013-10-07 DIAGNOSIS — R05 Cough: Secondary | ICD-10-CM | POA: Insufficient documentation

## 2013-10-07 NOTE — Progress Notes (Signed)
         PROGRESS NOTE  DATE: 10/06/2013  FACILITY:  [facility]  LEVEL OF CARE: ALF (13)  Acute Visit  CHIEF COMPLAINT:  Manage cough  HISTORY OF PRESENT ILLNESS: I was requested by the staff to assess the patient regarding above problem(s):  Staff reports that patient has been having a nonproductive cough and wheezing for several days. He is a poor historian. Staff did not report any respiratory insufficiency, fever, chills or night sweats. they cannot identify precipitating or alleviating factors. There is no temporal relationship.  PAST MEDICAL HISTORY : Reviewed.  No changes.  [facility]S: [facility]  REVIEW OF SYSTEMS: Unobtainable due to patient being a poor historian  PHYSICAL EXAMINATION  VS:  T 97.3        P 76       RR 20       BP 124/54        GENERAL: no acute distress, moderately obese body habitus EYES: conjunctivae normal, sclerae normal, normal eye lids NECK: supple, trachea midline, no neck masses, no thyroid tenderness, no thyromegaly LYMPHATICS: no LAN in the neck, no supraclavicular LAN RESPIRATORY: breathing is even & unlabored, BS decreased, occasional wheezing CARDIAC: RRR, no murmur,no extra heart sounds, left lower extremity +6 edema, right lower extremity +2 edema  ASSESSMENT/PLAN:  Cough-new problem. Obtain chest x-ray. Start Mucinex 600 mg by mouth twice a day x7 days.  CPT CODE: [id number]

## 2013-10-10 ENCOUNTER — Encounter: Payer: Self-pay | Admitting: Family

## 2013-10-10 ENCOUNTER — Non-Acute Institutional Stay (SKILLED_NURSING_FACILITY): Payer: Medicare Other | Admitting: Family

## 2013-10-10 DIAGNOSIS — E039 Hypothyroidism, unspecified: Secondary | ICD-10-CM

## 2013-10-10 DIAGNOSIS — Z7189 Other specified counseling: Secondary | ICD-10-CM

## 2013-10-10 DIAGNOSIS — E559 Vitamin D deficiency, unspecified: Secondary | ICD-10-CM

## 2013-10-10 DIAGNOSIS — Z716 Tobacco abuse counseling: Secondary | ICD-10-CM

## 2013-10-10 DIAGNOSIS — F259 Schizoaffective disorder, unspecified: Secondary | ICD-10-CM

## 2013-10-10 DIAGNOSIS — I10 Essential (primary) hypertension: Secondary | ICD-10-CM

## 2013-10-10 NOTE — Progress Notes (Signed)
Patient ID: Kristopher Pitts, male   DOB: 06/30/1946, 68 y.o.   MRN: [id number]  Date: 10/10/13 [facility]  Code Status:  @emerg @  Chief Complaint  Patient presents with  . Acute Visit    Abnormal Labs    HPI: F/U for abnormal labs ordered for routine visit. Pt denies issues and concerns at present       Allergies  Allergen Reactions  . Coumadin [Warfarin Sodium]   . Lithium      Medication List       This list is accurate as of: 10/10/13  3:00 PM.  Always use your most recent med list.               AMBULATORY NON FORMULARY MEDICATION  Medication Name: Lorazepam 1 mg /ML gel, apply 1 ML topically every 8 hours as needed     benztropine 0.5 MG tablet  Commonly known as:  COGENTIN  Take 0.5 mg by mouth daily.     clonazePAM 0.5 MG tablet  Commonly known as:  KLONOPIN  Take 1 tablet (0.5 mg total) by mouth 3 (three) times daily as needed for anxiety.     divalproex 250 MG DR tablet  Commonly known as:  DEPAKOTE  Take 750 mg by mouth 2 (two) times daily.     FANAPT 10 MG Tabs  Generic drug:  Iloperidone  Take 1 tablet by mouth daily.     finasteride 5 MG tablet  Commonly known as:  PROSCAR  Take 5 mg by mouth daily.     fluPHENAZine 10 MG tablet  Commonly known as:  PROLIXIN  Take 10 mg by mouth 2 (two) times daily.     furosemide 20 MG tablet  Commonly known as:  LASIX  Take 20 mg by mouth 3 (three) times daily.     levothyroxine 75 MCG tablet  Commonly known as:  SYNTHROID, LEVOTHROID  Take 37.5 mcg by mouth daily.     olanzapine zydis 15 MG disintegrating tablet  Commonly known as:  ZYPREXA  Take 15 mg by mouth at bedtime.     oxybutynin 5 MG tablet  Commonly known as:  DITROPAN  Take 5 mg by mouth daily.     phenytoin 100 MG ER capsule  Commonly known as:  DILANTIN  Take 100 mg by mouth 2 (two) times daily.     potassium chloride 10 MEQ tablet  Commonly known as:  K-DUR,KLOR-CON  Take 10 mEq by mouth daily.     tamsulosin 0.4 MG Caps capsule  Commonly known as:  FLOMAX  Take 0.4 mg by mouth daily.     topiramate 100 MG tablet  Commonly known as:  TOPAMAX  Take 100 mg by mouth daily.         DATA REVIEWED   Laboratory Studies: 10/04/13- WBC 5.8, Hemoglobin 15.6, Hematocrit 43.7, Platelets 244, BUN 13, Creatinine 0.80, Potassium 3.9, Chloride 103, Calcium 8.8, Vitamin B 12 708, Folate 6.2, TSH 1.060, Vitamin D 10.2     Past Medical History  Diagnosis Date  . Unspecified essential hypertension   . Unspecified hypothyroidism   . Unspecified epilepsy without mention of intractable epilepsy   . Bipolar disorder, unspecified   . Schizoaffective disorder, unspecified condition   . Chronic airway obstruction, not elsewhere classified   . Nonspecific abnormal toxicological findings   . Muscle weakness (generalized)   . Hereditary edema of legs   . Deep vein thrombophlebitis of leg, unspecified laterality   .  Asthma        Review of Systems  Unable to perform ROS: other  Garbled Speech  Physical Exam Filed Vitals:   10/10/13 1458  BP: 124/78  Pulse: 80  Temp: 96.8 F (36 C)  Resp: 18   There is no height or weight on file to calculate BMI. Physical Exam  HENT:  Mouth/Throat: Abnormal dentition.  Chewing tobacco noted on bedside table  Cardiovascular: Normal rate and regular rhythm.   Pulmonary/Chest: Effort normal and breath sounds normal.  Neurological: He is alert.  Oriented to self    ASSESSMENT/PLAN  Vitamin D Deficiency 50,000IU q wk x 8 wk Education provided regarding Vitamin D Deficiency Smoking cessation counseling -15 minutes   Follow up:prn  Time Spent 50 minutes

## 2013-10-17 ENCOUNTER — Encounter: Payer: Self-pay | Admitting: *Deleted

## 2013-11-07 ENCOUNTER — Non-Acute Institutional Stay (SKILLED_NURSING_FACILITY): Payer: Medicare Other | Admitting: Family

## 2013-11-07 DIAGNOSIS — G40909 Epilepsy, unspecified, not intractable, without status epilepticus: Secondary | ICD-10-CM

## 2013-11-07 DIAGNOSIS — F411 Generalized anxiety disorder: Secondary | ICD-10-CM

## 2013-11-07 DIAGNOSIS — I10 Essential (primary) hypertension: Secondary | ICD-10-CM

## 2013-11-07 DIAGNOSIS — F259 Schizoaffective disorder, unspecified: Secondary | ICD-10-CM

## 2013-11-07 DIAGNOSIS — E039 Hypothyroidism, unspecified: Secondary | ICD-10-CM

## 2013-11-08 ENCOUNTER — Non-Acute Institutional Stay: Payer: Medicare Other | Admitting: Internal Medicine

## 2013-11-08 ENCOUNTER — Encounter: Payer: Self-pay | Admitting: *Deleted

## 2013-11-08 DIAGNOSIS — R05 Cough: Secondary | ICD-10-CM

## 2013-11-08 DIAGNOSIS — R059 Cough, unspecified: Secondary | ICD-10-CM

## 2013-11-08 NOTE — Progress Notes (Signed)
         PROGRESS NOTE  DATE: 11/08/2013  FACILITY:  [facility]  LEVEL OF CARE: ALF (13)  Acute Visit  CHIEF COMPLAINT:  Manage cough  HISTORY OF PRESENT ILLNESS: I was requested by the staff to assess the patient regarding above problem(s):  Patient is having persistent cough and congestion. He was treated for pneumonia in last month. Patient is a poor historian. Staff did not report respiratory insufficiency. There is no temporal relationship. There are no other associated signs and symptoms. Chest x-ray on 10-28-13 did not show any acute findings.  PAST MEDICAL HISTORY : Reviewed.  No changes.  CURRENT MEDICATIONS: [facility]  REVIEW OF SYSTEMS: Unobtainable due to patient being a poor historian  PHYSICAL EXAMINATION  VS:  T 98.4        P 82       RR 16      BP 118/84      GENERAL: no acute distress, morbidly obese body habitus NECK: supple, trachea midline, no neck masses, no thyroid tenderness, no thyromegaly LYMPHATICS: no LAN in the neck, no supraclavicular LAN RESPIRATORY: breathing is even & unlabored, BS CTAB CARDIAC: RRR, no murmur,no extra heart sounds, left lower extremity has +6 edema and right lower extremity has +2 edema  LABS/RADIOLOGY: See history of present illness  ASSESSMENT/PLAN:  Cough-persistent problem. Very likely patient has aspiration. Obtain speech therapy consult. Start Mucinex 600 mg twice a day for one week.  CPT CODE: [id number]

## 2013-11-09 ENCOUNTER — Encounter: Payer: Self-pay | Admitting: Neurology

## 2013-11-11 ENCOUNTER — Encounter: Payer: Self-pay | Admitting: Neurology

## 2013-11-11 ENCOUNTER — Ambulatory Visit (INDEPENDENT_AMBULATORY_CARE_PROVIDER_SITE_OTHER): Payer: Medicare Other | Admitting: Neurology

## 2013-11-11 ENCOUNTER — Encounter (INDEPENDENT_AMBULATORY_CARE_PROVIDER_SITE_OTHER): Payer: Self-pay

## 2013-11-11 VITALS — BP 102/70 | HR 93

## 2013-11-11 DIAGNOSIS — F209 Schizophrenia, unspecified: Secondary | ICD-10-CM

## 2013-11-11 DIAGNOSIS — R1310 Dysphagia, unspecified: Secondary | ICD-10-CM | POA: Insufficient documentation

## 2013-11-11 DIAGNOSIS — M6281 Muscle weakness (generalized): Secondary | ICD-10-CM | POA: Insufficient documentation

## 2013-11-11 NOTE — Progress Notes (Signed)
Reason for visit: Dysphagia  Kristopher Pitts is a 68 y.o. male  History of present illness:  Mr. Kristopher Pitts is a 68 year old white male with a history of bipolar disorder and schizoaffective disorder. [facility]. The patient is nonambulatory, and he comes in a wheelchair. The patient is quite demented, and he comes in with a caretaker that knows nothing about him. The referral note mentions something about dysphagia. The duration of this is not clear. There is a mention of muscular weakness, the duration of this is not clear. The patient is unable to give any history. The patient is on Dilantin for seizures, but he also takes Depakote. Recent blood levels have shown a Dilantin level of 10.2, and a Depakote level of 63. A CBC was unremarkable, and a chemistry profile was unremarkable. The patient is on Prolixin and Cogentin. The patient also takes Topamax. The patient is sent for an evaluation.  Past Medical History  Diagnosis Date  . Unspecified essential hypertension   . Unspecified hypothyroidism   . Unspecified epilepsy without mention of intractable epilepsy   . Bipolar disorder, unspecified   . Schizoaffective disorder, unspecified condition   . Chronic airway obstruction, not elsewhere classified   . Nonspecific abnormal toxicological findings   . Muscle weakness (generalized)   . Hereditary edema of legs   . Deep vein thrombophlebitis of leg, unspecified laterality   . Asthma     Past Surgical History  Procedure Laterality Date  . None      Family History  Problem Relation Age of Onset  . Family history unknown: Yes    Social history:  reports that he has never smoked. He has never used smokeless tobacco. He reports that he does not drink alcohol. His drug history is not on file.  Medications:  Current Outpatient Prescriptions on File Prior to Visit  Medication Sig Dispense Refill  . AMBULATORY NON  FORMULARY MEDICATION Medication Name: Lorazepam 1 mg /ML gel, apply 1 ML topically every 8 hours as needed  30 Syringe  5  . benztropine (COGENTIN) 0.5 MG tablet Take 0.5 mg by mouth daily.      . clonazePAM (KLONOPIN) 0.5 MG tablet Take 1 tablet (0.5 mg total) by mouth 3 (three) times daily as needed for anxiety.  90 tablet  5  . divalproex (DEPAKOTE) 250 MG DR tablet Take 750 mg by mouth 2 (two) times daily. Take 3 tablets by mouth  daily  to equal 750 mg.      . finasteride (PROSCAR) 5 MG tablet Take 5 mg by mouth daily.      . fluPHENAZine (PROLIXIN) 10 MG tablet Take 10 mg by mouth 2 (two) times daily.      . furosemide (LASIX) 20 MG tablet Take 20 mg by mouth 3 (three) times daily.      . Iloperidone (FANAPT) 12 MG TABS Take 12 mg by mouth 2 (two) times daily.      Marland Kitchen. ipratropium-albuterol (DUONEB) 0.5-2.5 (3) MG/3ML SOLN Take 3 mLs by nebulization as needed. For wheezing.      Marland Kitchen. levothyroxine (SYNTHROID, LEVOTHROID) 75 MCG tablet Take 37.5 mcg by mouth daily.      Marland Kitchen. olanzapine zydis (ZYPREXA) 15 MG disintegrating tablet Take 15 mg by mouth at bedtime.      Marland Kitchen. oxybutynin (DITROPAN) 5 MG tablet Take 5 mg by mouth daily.      . phenytoin (DILANTIN) 100 MG ER capsule Take  100 mg by mouth 2 (two) times daily.      . potassium chloride (K-DUR,KLOR-CON) 10 MEQ tablet Take 10 mEq by mouth daily.      . Tamsulosin HCl (FLOMAX) 0.4 MG CAPS Take 0.4 mg by mouth daily.      Marland Kitchen topiramate (TOPAMAX) 100 MG tablet Take 100 mg by mouth daily.       No current facility-administered medications on file prior to visit.      Allergies  Allergen Reactions  . Coumadin [Warfarin Sodium]   . Lithium     ROS:  Out of a complete 14 system review of symptoms, the patient complains only of the following symptoms, and all other reviewed systems are negative.  Review of systems cannot be obtained. The patient is coughing.  Blood pressure 102/70, pulse 93, weight 0 lb (0 kg).  Physical Exam  General: The  patient sleepy, but can be alerted at the time of examination.  Eyes: Pupils are equal, round, and reactive to light. Discs are flat bilaterally.  Neck: The neck is supple, no carotid bruits are noted.  Respiratory: The respiratory examination is notable for occasional bilateral rhonchi. The patient is coughing on occasion  Cardiovascular: The cardiovascular examination reveals a regular rate and rhythm, no obvious murmurs or rubs are noted.  Skin: Extremities are with severe peripheral edema, with 4+ edema on the entirety of the left leg, significant edema of the right leg as well, mainly below the knee.  Neurologic Exam  Mental status: The patient is able to state his name, otherwise not oriented, not able to give a history whatsoever. The patient will laugh spontaneously at times.  Cranial nerves: Facial symmetry is present. Speech is very dysarthric. Extraocular movements are full. Visual fields are full, patient blinks to threat bilaterally . The tongue is midline, and the patient has symmetric elevation of the soft palate. No obvious hearing deficits are noted.  Motor: The motor testing reveals good apparent strength in arms. The patient will not move the legs much, severe edema of the left leg is noted.  Sensory: Sensory testing is intact to deep pain stimulation on all fours. Otherwise, sensory testing cannot be done.  Coordination: Cerebellar testing could not be performed, the patient does not follow commands well.  Gait and station: Gait could not be tested, the patient is nonambulatory, wheelchair-bound.  Reflexes: Deep tendon reflexes are symmetric, but are depressed  bilaterally. Toes are downgoing bilaterally.   Assessment/Plan:  1. Schizoaffective disorder, bipolar disorder  2. Dementia  3. Reported dysphagia  4. Reported muscular weakness  The clinical history with this patient is quite limited. It is unclear what is new regarding this gentleman's medical  issues. 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 The patient is on Depakote and this may lead to hyperammonemia. The patient will be evaluated for cerebrovascular disease and obtain a CT scan of the brain. The patient will undergo blood work today. If the CK enzymes are elevated, we may consider EMG evaluation in the future. The patient will be sent for a modified barium swallow. The patient will followup if needed.  Marlan Palau MD 11/12/2013 12:29 PM  [facility] [address] [place], [place] [address]  Phone [phone] Fax [phone]

## 2013-11-14 LAB — ANGIOTENSIN CONVERTING ENZYME: Angio Convert Enzyme: 34 U/L (ref 14–82)

## 2013-11-14 LAB — ANA W/REFLEX: Anti Nuclear Antibody(ANA): NEGATIVE

## 2013-11-14 LAB — AMMONIA: AMMONIA: 65 ug/dL (ref 27–102)

## 2013-11-14 LAB — ACETYLCHOLINE RECEPTOR, BINDING

## 2013-11-14 LAB — CK: CK TOTAL: 23 U/L — AB (ref 24–204)

## 2013-11-15 ENCOUNTER — Telehealth: Payer: Self-pay | Admitting: Neurology

## 2013-11-15 NOTE — Telephone Encounter (Signed)
Called patient's brother was informed that the [facility] resides [facility] [place], called the nursing home [phone], to speak with his nurse no answer.

## 2013-11-16 NOTE — Telephone Encounter (Signed)
Patient was called at [facility] and spoke with patient's nurse Sineatha concerning patient's blood work results being normal and I advise the nurse that if the patient has any other problems, questions or concerns to call the office. Patient's nurse verbalized understanding.

## 2013-11-18 ENCOUNTER — Other Ambulatory Visit (HOSPITAL_COMMUNITY): Payer: Self-pay | Admitting: Neurology

## 2013-11-18 DIAGNOSIS — R1310 Dysphagia, unspecified: Secondary | ICD-10-CM

## 2013-11-21 ENCOUNTER — Ambulatory Visit: Payer: Medicare Other

## 2013-11-21 ENCOUNTER — Ambulatory Visit (HOSPITAL_COMMUNITY)
Admission: RE | Admit: 2013-11-21 | Discharge: 2013-11-21 | Disposition: A | Discharge status: Home / Self Care | Payer: Medicare Other | Source: Ambulatory Visit | Attending: Neurology | Admitting: Neurology

## 2013-11-21 ENCOUNTER — Telehealth: Payer: Self-pay | Admitting: Neurology

## 2013-11-21 DIAGNOSIS — F209 Schizophrenia, unspecified: Secondary | ICD-10-CM

## 2013-11-21 DIAGNOSIS — M6281 Muscle weakness (generalized): Secondary | ICD-10-CM

## 2013-11-21 DIAGNOSIS — G319 Degenerative disease of nervous system, unspecified: Secondary | ICD-10-CM | POA: Insufficient documentation

## 2013-11-21 DIAGNOSIS — R1310 Dysphagia, unspecified: Secondary | ICD-10-CM | POA: Insufficient documentation

## 2013-11-21 NOTE — Telephone Encounter (Signed)
CT scan of brain was unremarkable, no acute changes seen. No source of dysphagia noted.  CT scan brain November 21 2013:  IMPRESSION: No acute finding. Chronic brain atrophy. 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

## 2013-11-22 ENCOUNTER — Ambulatory Visit (HOSPITAL_COMMUNITY): Payer: Medicare Other

## 2013-11-22 ENCOUNTER — Inpatient Hospital Stay (HOSPITAL_COMMUNITY): Admission: RE | Admit: 2013-11-22 | Payer: Medicare Other | Source: Ambulatory Visit

## 2013-11-29 ENCOUNTER — Other Ambulatory Visit (HOSPITAL_COMMUNITY): Payer: Self-pay | Admitting: Neurology

## 2013-11-29 ENCOUNTER — Ambulatory Visit (HOSPITAL_COMMUNITY)
Admission: RE | Admit: 2013-11-29 | Discharge: 2013-11-29 | Disposition: A | Discharge status: Home / Self Care | Payer: Medicare Other | Source: Ambulatory Visit | Attending: Neurology | Admitting: Neurology

## 2013-11-29 ENCOUNTER — Inpatient Hospital Stay (HOSPITAL_COMMUNITY): Admission: RE | Admit: 2013-11-29 | Payer: Medicare Other | Source: Ambulatory Visit

## 2013-11-29 ENCOUNTER — Ambulatory Visit (HOSPITAL_COMMUNITY): Payer: Medicare Other

## 2013-11-29 DIAGNOSIS — R1310 Dysphagia, unspecified: Secondary | ICD-10-CM

## 2013-11-29 DIAGNOSIS — M6281 Muscle weakness (generalized): Secondary | ICD-10-CM

## 2013-11-29 DIAGNOSIS — F209 Schizophrenia, unspecified: Secondary | ICD-10-CM

## 2013-11-29 NOTE — Procedures (Signed)
Objective Swallowing Evaluation:    Patient Details  Name: Kristopher Pitts MRN: [id number] Date of Birth: 05/10/1946  Today's Date: 11/29/2013 Time: 8657-8469 SLP Time Calculation (min): 43 min  Past Medical History:  Past Medical History  Diagnosis Date  . Unspecified essential hypertension   . Unspecified hypothyroidism   . Unspecified epilepsy without mention of intractable epilepsy   . Bipolar disorder, unspecified   . Schizoaffective disorder, unspecified condition   . Chronic airway obstruction, not elsewhere classified   . Nonspecific abnormal toxicological findings   . Muscle weakness (generalized)   . Hereditary edema of legs   . Deep vein thrombophlebitis of leg, unspecified laterality   . Asthma    Past Surgical History:  Past Surgical History  Procedure Laterality Date  . None     HPI:  Mr. Kristopher Pitts is a 68 year old white male referred for an outpatient MBS by Dr. Anne Pitts. Pt has h/o dysphagia with MBS being completed Sep 27, 2013 indicating moderate oropharyngeal sensorimotor dysphagia with trace aspiration of thin and penetration of nectar that mixed with secretions. During last MBS pt was noted to have difficulty holding head up, ? accessory muscle nerve impact and was drooling - ? facial nerve impairment.   Recommendation was made for soft diet and defered to referring SLP and MD to determine liquid recommendations.  Pt did not follow commands and had used a sippy cup at facility per previous referral form.  Pt referred to Dr Kristopher Pitts due to concern for dysphagia and recurrent pulmonary issues.  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       Assessment / Plan / Recommendation Clinical Impression  Dysphagia  Diagnosis: Moderate oral phase dysphagia;Moderate pharyngeal phase dysphagia (suspect esophageal dysphagia as well)   Clinical impression: Moderate oropharyngeal dysphagia continues (as seen on MBS 09/27/2013) with sensorimotor deficits.  Decreased oral bolus control noted due to weakness/discoordination results in premature spillage of boluses into pharynx and mild oral residuals.  Pharyngeal swallow again was intermittently mildly delayed resulting in + GROSS audible aspiration of thin via straw due to stasis at pyriform sinus before swallow.  Unfortunately cough response was not strong enough to fully clear aspirates.  Pharyngeal swallow is strong with only mild to trace residuals.  Small single cup sips of thin were not aspirated of penetrated during swallow.    Please note, once during testing, pt was noted to have laryngeal infiltration of barium WITHOUT AWARENESS- presumed due to oral residuals spilling into pharynx/larynx after the swallow.  Pt did not produce strong cough - but attempted throat clearing without adequate success to fully clear larynx.  Suspect pt has chronic aspiration of secretions and may benefit from frequent oral suctioning before, during and after meals.     Also appearance of decreased esophageal clearance without pt awareness may also contribute to pt's aspiration risk.  Pt's cognition decreases ability to follow compensation strategies and subsequently impacts pt's ability to protect his airway.    Recommend consider continuing soft/thin (NO STRAWS) with strict precautions and compensation strategies.  Pt educated during testing to findings, recommendations and use of compensation strategies but uncertain of comprehension.      Treatment Recommendation  Defer treatment plan to SLP at (Comment)    Diet Recommendation Dysphagia 3 (Mechanical Soft);Thin liquid   Liquid Administration  via: Cup;No straw Medication Administration: Crushed with puree (start and follow with  water) Supervision: Full supervision/cueing for compensatory strategies Compensations: Slow rate;Small sips/bites;Follow solids with liquid;Clear throat intermittently (intermittent dry swallow and intermittent strong throat clear/cough, oral suction t/o meal as able) Postural Changes and/or Swallow Maneuvers: Seated upright 90 degrees;Upright 30-60 min after meal    Other  Recommendations Oral Care Recommendations: Oral care before and after PO             SLP Swallow Goals     General Date of Onset: 11/29/13 HPI: Mr. Kristopher Pitts is a 68 year old white male referred for an outpatient MBS by Dr. Anne Pitts. Pt has h/o dysphagia with MBS being completed Sep 27, 2013 indicating moderate oropharyngeal sensorimotor dysphagia with trace aspiration of thin and penetration of nectar that mixed with secretions. During last MBS pt was noted to have difficulty holding head up, ? accessory muscle nerve impact and was drooling - ? facial nerve impairment.   Recommendation was made for soft diet and defered to referring SLP and MD to determine liquid recommendations.  Pt did not follow commands and had used a sippy cup at facility per previous referral form.  Pt referred to Dr Kristopher Pitts due to concern for dysphagia and recurrent pulmonary issues.  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   Reason for Referral: Objectively evaluate swallowing function Diet Prior to this Study: Other (Comment) (unknown, no referral form attached) Temperature Spikes Noted: No Respiratory Status: Room air History of Recent Intubation: No Behavior/Cognition: Alert;Requires cueing;Distractible;Decreased sustained attention;Hard of hearing Oral Cavity - Dentition: Edentulous Oral  Motor / Sensory Function:  (appeared with xerostomia, ? accessory nerve impact due to decreased ability to hold head up, kyphotic, severely dysarthric, drooling) Self-Feeding Abilities: Needs assist (able to give himself sips via small cup) Patient Positioning: Upright in chair Baseline Vocal Quality: Clear Volitional Cough: Cognitively unable to elicit Volitional Swallow: Able to elicit Anatomy: Within functional limits (kyphotic, appearance of osteophyte near C4-C5 impinging into pharynx (cervical spine study in 2011 showed disc height loss at C5-C6, anterior bulging and partial ankylosis at C5-C6) Pharyngeal Secretions: Not observed secondary MBS    Reason for Referral Objectively evaluate swallowing function   Oral Phase Oral Preparation/Oral Phase Oral Phase: Impaired Oral - Nectar Oral - Nectar Teaspoon: Weak lingual manipulation;Reduced posterior propulsion Oral - Nectar Cup: Weak lingual manipulation;Reduced posterior propulsion Oral - Thin Oral - Thin Teaspoon: Weak lingual manipulation;Reduced posterior propulsion Oral - Thin Cup: Weak lingual manipulation;Reduced posterior propulsion Oral - Thin Straw: Weak lingual manipulation;Reduced posterior propulsion Oral - Solids Oral - Puree: Weak lingual manipulation;Reduced posterior propulsion Oral - Regular: Weak lingual manipulation;Reduced posterior propulsion;Impaired mastication   Pharyngeal Phase Pharyngeal Phase Pharyngeal Phase: Impaired Pharyngeal - Nectar Pharyngeal - Nectar Teaspoon: Premature spillage to valleculae Pharyngeal - Nectar Cup: Premature spillage to valleculae Pharyngeal - Thin Pharyngeal - Thin Teaspoon: Premature spillage to valleculae Pharyngeal - Thin Cup: Premature spillage to valleculae Pharyngeal - Thin Straw: Premature spillage to valleculae;Premature spillage to pyriform sinuses;Delayed swallow initiation;Penetration/Aspiration before swallow;Penetration/Aspiration during swallow;Moderate  aspiration Penetration/Aspiration details (thin straw): Material enters airway, passes BELOW cords and not ejected out despite cough attempt by patient Pharyngeal - Solids Pharyngeal - Puree: Premature spillage to valleculae Pharyngeal - Regular: Premature spillage to valleculae Pharyngeal Phase - Comment Pharyngeal Comment:  gross aspiration with large straw boluses, sequential small cup sips of thin were not aspirated or penetrated,  turning on xray during mid-eval observed barium stasis in larynx - suspect from oral residuals spilling into pharynx/larynx without reflexive swallow response, pt did not cough or dry swallow on command  Cervical Esophageal Phase    GO    Cervical Esophageal Phase Cervical Esophageal Phase: Impaired Cervical Esophageal Phase - Comment Cervical Esophageal Comment: Pt appeared with significant distal esophageal stasis during esophageal sweep (x1) WITHOUT sensation.  Thin barium and water given to faciliate clearance appeared to backflow but did appear to help clearance of puree/solids.  Suspect motility issues likely and kyphosis likely contributes to deficits.  Radiologist not present to confirm findings.  -     Functional Assessment Tool Used: mbs. clinical judgement Functional Limitations: Swallowing Swallow Current Status ([id number](G8996): At least 20 percent but less than 40 percent impaired, limited or restricted Swallow Goal Status [phone](G8997): At least 20 percent but less than 40 percent impaired, limited or restricted Swallow Discharge Status [phone](G8998): At least 20 percent but less than 40 percent impaired, limited or restricted    Mills KollerKimball, Aleshka Corney Ann Maddisyn Hegwood, MS [facility] SLP [phone]

## 2013-12-19 ENCOUNTER — Encounter: Payer: Self-pay | Admitting: Family

## 2013-12-19 NOTE — Progress Notes (Signed)
Patient ID: Kristopher RaiderJames Pitts, male   DOB: 01/04/1946, 68 y.o.   MRN: [id number]  Date: 11/07/13 [facility]  Code Status:  Full  Chief Complaint  Patient presents with  . Medical Managment of Chronic Issues    Routine  Visit    HPI: Pt is being followed for the medical management of chronic illnesses. Health care team denies concerns/issues at present.       Medication List       This list is accurate as of: 11/07/13 11:59 PM.  Always use your most recent med list.               AMBULATORY NON FORMULARY MEDICATION  Medication Name: Lorazepam 1 mg /ML gel, apply 1 ML topically every 8 hours as needed     benztropine 0.5 MG tablet  Commonly known as:  COGENTIN  Take 0.5 mg by mouth daily.     clonazePAM 0.5 MG tablet  Commonly known as:  KLONOPIN  Take 1 tablet (0.5 mg total) by mouth 3 (three) times daily as needed for anxiety.     divalproex 250 MG DR tablet  Commonly known as:  DEPAKOTE  Take 750 mg by mouth 2 (two) times daily. Take 3 tablets by mouth  daily  to equal 750 mg.     FANAPT 10 MG Tabs  Generic drug:  Iloperidone  Take 1 tablet by mouth daily.     FANAPT 12 MG Tabs  Generic drug:  Iloperidone  Take 12 mg by mouth 2 (two) times daily.     finasteride 5 MG tablet  Commonly known as:  PROSCAR  Take 5 mg by mouth daily.     fluPHENAZine 10 MG tablet  Commonly known as:  PROLIXIN  Take 10 mg by mouth 2 (two) times daily.     furosemide 20 MG tablet  Commonly known as:  LASIX  Take 20 mg by mouth 3 (three) times daily.     ipratropium-albuterol 0.5-2.5 (3) MG/3ML Soln  Commonly known as:  DUONEB  Take 3 mLs by nebulization as needed. For wheezing.     levothyroxine 75 MCG tablet  Commonly known as:  SYNTHROID, LEVOTHROID  Take 37.5 mcg by mouth daily.     olanzapine zydis 15 MG disintegrating tablet  Commonly known as:  ZYPREXA  Take 15 mg by mouth at bedtime.     oxybutynin 5 MG tablet  Commonly known as:  DITROPAN  Take 5  mg by mouth daily.     phenytoin 100 MG ER capsule  Commonly known as:  DILANTIN  Take 100 mg by mouth 2 (two) times daily.     potassium chloride 10 MEQ tablet  Commonly known as:  K-DUR,KLOR-CON  Take 10 mEq by mouth daily.     tamsulosin 0.4 MG Caps capsule  Commonly known as:  FLOMAX  Take 0.4 mg by mouth daily.     topiramate 100 MG tablet  Commonly known as:  TOPAMAX  Take 100 mg by mouth daily.         DATA REVIEWED  Laboratory Studies:Reviewed  Past Medical History  Diagnosis Date  . Unspecified essential hypertension   . Unspecified hypothyroidism   . Unspecified epilepsy without mention of intractable epilepsy   . Bipolar disorder, unspecified   . Schizoaffective disorder, unspecified condition   . Chronic airway obstruction, not elsewhere classified   . Nonspecific abnormal toxicological findings   . Muscle weakness (generalized)   . Hereditary edema  of legs   . Deep vein thrombophlebitis of leg, unspecified laterality   . Asthma      Review of Systems  Unable to perform ROS: other  Garbled Speech  Physical Exam Filed Vitals:   11/07/13 1314  BP: 102/60  Pulse: 70  Temp: 97.1 F (36.2 C)  Resp: 20   Physical Exam  HENT:  Head: Normocephalic.  Eyes: EOM are normal. Pupils are equal, round, and reactive to light.  Neck: Normal range of motion. No JVD present. No tracheal deviation present. No thyromegaly present.  Cardiovascular: Normal rate and regular rhythm.   Pulmonary/Chest: Effort normal and breath sounds normal.  Abdominal: Soft. Bowel sounds are normal.  Musculoskeletal: Normal range of motion.  BUE 5/5 strength BLE 4/5 strength  Lymphadenopathy:    He has no cervical adenopathy.  Neurological: He is alert.  Garbled Speech    ASSESSMENT/PLAN  Hypertension-will continue on current treatment plan; pt is stable Hypothyroidism-will order labs to monitor thyroid function Schizoaffective Disorder-will continue will treatment plan;  pt is stable Seizures-will continue with current treatment plan; pt is stable DVT prophylaxis-will continue Xarelto; pt is stable BPH-will continue Flomax    Follow up: prn

## 2014-02-01 ENCOUNTER — Non-Acute Institutional Stay: Payer: Medicare Other | Admitting: Internal Medicine

## 2014-02-01 DIAGNOSIS — R609 Edema, unspecified: Secondary | ICD-10-CM

## 2014-02-03 DIAGNOSIS — R609 Edema, unspecified: Secondary | ICD-10-CM | POA: Insufficient documentation

## 2014-02-03 NOTE — Progress Notes (Signed)
Patient ID: Kristopher Pitts, male   DOB: 06/19/1946, 68 y.o.   MRN: [id number]            PROGRESS NOTE  DATE: 02/01/2014    FACILITY:  [facility]  LEVEL OF CARE: ALF (13)  Acute Visit   CHIEF COMPLAINT:  Manage lower extremity swelling.    HISTORY OF PRESENT ILLNESS: I was requested by the staff to assess the patient regarding above problem(s):  Staff report that patient has been having increased left lower extremity swelling and left foot weeping.  Patient is a poor historian.  He has a chronic lymphedema.  He is currently on Lasix 60 mg q.d.   Staff cannot identify precipitating factors.    PAST MEDICAL HISTORY : Reviewed.  No changes/see problem list  CURRENT MEDICATIONS: Reviewed per MAR/see medication list  REVIEW OF SYSTEMS:  Unobtainable.  Patient is a poor historian.    PHYSICAL EXAMINATION  VS:  T 96.4       P 86      RR 18      BP 124/80       GENERAL: no acute distress, moderately obese body habitus NECK: supple, trachea midline, no neck masses, no thyroid tenderness, no thyromegaly RESPIRATORY: breathing is even & unlabored, BS CTAB CARDIAC: RRR, no murmur,no extra heart sounds, left lower extremity has +6 edema, right lower extremity has +2 edema, no weeping noted   GI: abdomen soft, normal BS, no masses, no tenderness, no hepatomegaly, no splenomegaly PSYCHIATRIC: the patient is alert & oriented to person, affect & behavior appropriate  LABS/RADIOLOGY: In 09/2013:  BMP normal.    ASSESSMENT/PLAN:  Increased left lower extremity edema.  Unstable problem.  Increase Lasix to 80 mg q.d. for three days, then decrease back to 60 mg q.d.    CPT CODE: [id number]       Angela CoxGayani Y Vaughan Garfinkle, MD [facility] [phone]

## 2014-02-07 ENCOUNTER — Non-Acute Institutional Stay: Payer: Medicare Other | Admitting: Internal Medicine

## 2014-02-07 DIAGNOSIS — I82409 Acute embolism and thrombosis of unspecified deep veins of unspecified lower extremity: Secondary | ICD-10-CM

## 2014-02-07 DIAGNOSIS — Z7901 Long term (current) use of anticoagulants: Secondary | ICD-10-CM

## 2014-02-08 NOTE — Progress Notes (Addendum)
Patient ID: Kristopher Pitts, male   DOB: 03/09/1946, 68 y.o.   MRN: [id number]                  PROGRESS NOTE  DATE:  02/07/2014      [facility]    LEVEL OF CARE:   Assisted Living     Acute Visit   CHIEF COMPLAINT:  Follow up use of Xarelto.    HISTORY OF PRESENT ILLNESS:  I have been asked to look at Mr. Kristopher Pitts, who is a gentleman who has a history of DVT.  He was admitted to hospital in 2011, having a previous left femoral DVT.  He  apparently developed Coumadin toxicity and underwent an IVC filter placement.   He was not felt to be an ongoing Coumadin candidate secondary to unsteady gait and history of falling.  As mentioned, he has a filter placement.    Apparently, he was started or switched to Xarelto in March 2014 and the consultant pharmacist is asking about the duration of treatment with regards to the Xarelto.  I have researched what is available to me and [facility].    PAST MEDICAL HISTORY/PROBLEM LIST:  Includes:    COPD.    Chronic severe lymphedema of the left leg.    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  At least in 2011, not felt to be a Coumadin candidate.    Hypothyroidism.    Seizure disorder.    BPH.    History of schizophrenia/bipolar disease with mental retardation.    I have reviewed [facility].  He had a CT scan of the head in February 2015 that showed no acute findings, chronic brain atrophy, old right cerebellar stroke.  I do not see anything with regards to his venous thromboembolic disease other than it exists.     PHYSICAL EXAMINATION:   VITAL SIGNS:   PULSE:  82.   RESPIRATIONS:  16.   O2 SATURATIONS:  94% on room air.   CHEST/RESPIRATORY:  Clear air entry bilaterally.   CARDIOVASCULAR:  CARDIAC:   Heart sounds are normal.  His JVP is not elevated.   EDEMA/VARICOSITIES:  He has massive edema of his left lower leg, lesser degrees of edema of the right leg.  I think  most of the left leg is chronic lymphedema.    ASSESSMENT/PLAN:  History of DVT with an IVC filter placement.  He appears to have been placed on Xarelto in March 2014.  Unfortunately, I have no information on this easily available.  There is nothing in [facility], nothing in his current chart.   I will need to look through his record in the facility to see if I can make an intelligent clinical decision with regards to the continuing or discontinuing of the Xarelto.  At this point, I just do not have enough clinical information to make an intelligent choice.

## 2014-02-11 ENCOUNTER — Non-Acute Institutional Stay: Payer: Medicare Other | Admitting: Internal Medicine

## 2014-02-11 DIAGNOSIS — Z7901 Long term (current) use of anticoagulants: Secondary | ICD-10-CM

## 2014-02-11 DIAGNOSIS — I82409 Acute embolism and thrombosis of unspecified deep veins of unspecified lower extremity: Secondary | ICD-10-CM

## 2014-02-14 NOTE — Progress Notes (Signed)
Patient ID: Kristopher RaiderJames Pitts, male   DOB: 07/01/1946, 68 y.o.   MRN: [id number]                  PROGRESS NOTE  DATE:  02/10/2014    [facility]    LEVEL OF CARE:   Assisted Living     Acute Visit   CHIEF COMPLAINT:  Follow up anticoagulation.    HISTORY OF PRESENT ILLNESS:  With some difficulty and the assistance of medical records, I have been able to resurrect the patient's anticoagulation history, although it has not been an easy task.  This is a gentleman who came to us initially in 2011.  At that point, he had already had a history of recurrent thrombosis in the left femoral vein.  At some point, he presented with a coagulopathy and bleeding while on Coumadin.  There was a risk of falls.  Therefore, the decision at that time was to stop the Coumadin and to place an IVC filter.    In March 2014, he was noted to have increasing swelling of his already chronically swollen left leg (venous stasis and lymphedema).  He had a duplex ultrasound done that showed thrombus at the common femoral vein, extending into the femoral vein and into the profundi.  He was started on Xarelto 20 mg at that time.    As far as I am aware, he has tolerated the Xarelto quite well.  He is no longer ambulating and has not had recent falls that I am aware of.    His hemoglobin was last checked in January at 15.4.  This has been stable.  Again, he has not had any bleeding that I am aware of.    PHYSICAL EXAMINATION:   GENERAL APPEARANCE:  The patient is not in any distress.  He has severe chronic edema in the left leg which is mostly lymphedema, but also some degree of venous stasis.     CHEST/RESPIRATORY:  Clear air entry bilaterally.    ASSESSMENT/PLAN:  Venous thromboembolic disease.  The patient has an IVC filter in place and clearly is at risk for recurrent problems with thromboembolic disease in his legs, acute-on-chronic DVTs.  His IVC filter, as far as I am aware, is still in place.  He had  an acute extensive DVT in March 2014 but had had problems with DVTs in the past, necessitating the placement of an IVC filter.  I think the correct approach here is to consider the Xarelto indefinitely.   In view of this as a recurrent problem, the dose of Xarelto at 20 mg a day is appropriate.  He appears to be tolerating this well.    CPT CODE: [id number]

## 2014-02-22 ENCOUNTER — Non-Acute Institutional Stay (SKILLED_NURSING_FACILITY): Payer: Medicare Other | Admitting: Internal Medicine

## 2014-02-22 DIAGNOSIS — F209 Schizophrenia, unspecified: Secondary | ICD-10-CM

## 2014-02-22 DIAGNOSIS — I10 Essential (primary) hypertension: Secondary | ICD-10-CM

## 2014-02-22 DIAGNOSIS — E039 Hypothyroidism, unspecified: Secondary | ICD-10-CM

## 2014-02-22 DIAGNOSIS — I890 Lymphedema, not elsewhere classified: Secondary | ICD-10-CM

## 2014-02-23 DIAGNOSIS — I10 Essential (primary) hypertension: Secondary | ICD-10-CM | POA: Insufficient documentation

## 2014-02-23 NOTE — Progress Notes (Signed)
Patient ID: Kristopher Pitts, male   DOB: 11/15/1945, 68 y.o.   MRN: [id number]         PROGRESS NOTE  DATE:  02/22/2014  [facility]   LEVEL OF CARE: SNF  Routine Visit  CHIEF COMPLAINT:  Manage lymphedema, hypothyroidism and seizure disorder.    HISTORY OF PRESENT ILLNESS:  REASSESSMENT OF ONGOING PROBLEM(S):  CHRONIC LYMPHEDEMA:  The patient has chronic lymphedema of bilateral lower extremities, left greater than right, which is hereditary edema.  He is overall a poor historian.  Staff do not report increasing lower extremity swelling.    HYPOTHYROIDISM: The hypothyroidism remains stable. No complications noted from the medications presently being used.  The staff denies fatigue or constipation.  Last TSH:  1.91 in 10/2012, in 8-14 TSH 1.1, in 1-15 TSH 1.06.  SEIZURE DISORDER: The patient's seizure disorder remains stable. No complications reported from the medications presently being used. Staff do not report any recent seizure activity.   PAST MEDICAL HISTORY : Reviewed.  No changes.  CURRENT MEDICATIONS: Reviewed per [facility]  REVIEW OF SYSTEMS:  Difficult to obtain secondary to mental retardation.    PHYSICAL EXAMINATION  VS: See VS section  GENERAL: no acute distress, moderately obese body habitus EYES: conjunctivae normal, sclerae normal, normal eye lids NECK: supple, trachea midline, no neck masses, no thyroid tenderness, no thyromegaly LYMPHATICS: no LAN in the neck, no supraclavicular LAN RESPIRATORY: breathing is even & unlabored, BS CTAB CARDIAC: RRR, no murmur,no extra heart sounds EDEMA/VARICOSITIES:  left lower extremity has +6 edema, right lower extremity has +2 edema ARTERIAL:  pedal pulses nonpalpable  GI: abdomen soft, normal BS, no masses, no tenderness, no hepatomegaly, no splenomegaly PSYCHIATRIC: the patient is alert & disoriented, affect & behavior appropriate  LABS/RADIOLOGY: 3-15 PSA 1.1, vitamin D level 57.8, CBC normal 1-15 fasting lipid  panel normal, BMP normal, vitamin B12 level 708, folate level 6.2 11-14 CBC normal 8-14 total protein 5.7, albumin 3.1 otherwise CMP normal, Depakote level 63, Dilantin level 7.6 7-14 HDL 34 otherwise fasting lipid panel normal  6/14 alb 3.4, dilantin level 9.4  12/2012:  CBC normal.   Albumin 3.3.    Dilantin level 7.    11/2012:   TSH 2.2.    10/2012:  Total protein 5.9, albumin 3.4, otherwise CMP normal.    Hemoglobin A1C 5.1.    Depakote level 22.    09/2012:  Fasting lipid panel normal.  ASSESSMENT/PLAN:  Chronic lymphedema.  This is hereditary.  Symptoms are stable.    Hypothyroidism.  Well controlled.     Hypertension.  Well controlled.   Schizophrenia.  Stable.   Seizure disorder.  Well controlled.   BPH.  No issues reported by the staff.    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    Hypokalemia.  Well repleted.  Continue supplementation.    Check liver profile and Depakote level  CPT CODE: [id number]

## 2014-03-20 ENCOUNTER — Encounter: Payer: Self-pay | Admitting: Internal Medicine

## 2014-03-20 ENCOUNTER — Non-Acute Institutional Stay (SKILLED_NURSING_FACILITY): Payer: Medicare Other | Admitting: Internal Medicine

## 2014-03-20 DIAGNOSIS — E039 Hypothyroidism, unspecified: Secondary | ICD-10-CM

## 2014-03-20 DIAGNOSIS — F2089 Other schizophrenia: Secondary | ICD-10-CM

## 2014-03-20 DIAGNOSIS — I890 Lymphedema, not elsewhere classified: Secondary | ICD-10-CM

## 2014-03-20 DIAGNOSIS — I10 Essential (primary) hypertension: Secondary | ICD-10-CM | POA: Insufficient documentation

## 2014-03-20 NOTE — Progress Notes (Signed)
This encounter was created in error - please disregard.

## 2014-03-20 NOTE — Progress Notes (Signed)
Patient ID: Kristopher RaiderJames Waren, male   DOB: 04/14/1946, 68 y.o.   MRN: [id number]         PROGRESS NOTE  DATE:  03/20/2014  [facility]   LEVEL OF CARE: SNF  Routine Visit  CHIEF COMPLAINT:  Manage lymphedema, hypothyroidism and seizure disorder.    HISTORY OF PRESENT ILLNESS:  REASSESSMENT OF ONGOING PROBLEM(S):  CHRONIC LYMPHEDEMA:  The patient has chronic lymphedema of bilateral lower extremities, left greater than right, which is hereditary edema.  He is overall a poor historian.  Staff do not report increasing lower extremity swelling.    HYPOTHYROIDISM: The hypothyroidism remains stable. No complications noted from the medications presently being used.  The staff denies fatigue or constipation.  Last TSH:  1.91 in 10/2012, in 8-14 TSH 1.1, in 1-15 TSH 1.06, in 5-15 TSH 2.19  SEIZURE DISORDER: The patient's seizure disorder remains stable. No complications reported from the medications presently being used. Staff do not report any recent seizure activity.   PAST MEDICAL HISTORY : Reviewed.  No changes.  [facility]  REVIEW OF SYSTEMS:  Difficult to obtain secondary to mental retardation.    PHYSICAL EXAMINATION  VS: See VS section  GENERAL: no acute distress, moderately obese body habitus NECK: supple, trachea midline, no neck masses, no thyroid tenderness, no thyromegaly RESPIRATORY: breathing is even & unlabored, BS CTAB CARDIAC: RRR, no murmur,no extra heart sounds EDEMA/VARICOSITIES:  left lower extremity has +6 edema, right lower extremity has +2 edema ARTERIAL:  pedal pulses nonpalpable  GI: abdomen soft, normal BS, no masses, no tenderness, no hepatomegaly, no splenomegaly PSYCHIATRIC: the patient is alert & disoriented, affect & behavior appropriate  LABS/RADIOLOGY: 6-15 Depakote level 42, liver profile normal 5-15 CMP normal, hemoglobin 17.8 otherwise CBC normal, Dilantin level 8.3 3-15 PSA 1.1, vitamin D level 57.8, CBC  normal 1-15 fasting lipid panel normal, BMP normal, vitamin B12 level 708, folate level 6.2 11-14 CBC normal 8-14 total protein 5.7, albumin 3.1 otherwise CMP normal, Depakote level 63, Dilantin level 7.6 7-14 HDL 34 otherwise fasting lipid panel normal  6/14 alb 3.4, dilantin level 9.4  12/2012:  CBC normal.   Albumin 3.3.    Dilantin level 7.    11/2012:   TSH 2.2.    10/2012:  Total protein 5.9, albumin 3.4, otherwise CMP normal.    Hemoglobin A1C 5.1.    Depakote level 22.    09/2012:  Fasting lipid panel normal.  ASSESSMENT/PLAN:  Chronic lymphedema.  This is hereditary.  Symptoms are stable.    Hypothyroidism.  Well controlled.     Hypertension.  Well controlled.   Schizophrenia.  Stable.   Seizure disorder.  Well controlled.   BPH.  No issues reported by the staff.    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    Hypokalemia.  Well repleted.  Continue supplementation.    CPT CODE: [id number]

## 2014-04-20 ENCOUNTER — Non-Acute Institutional Stay (SKILLED_NURSING_FACILITY): Payer: Medicare Other | Admitting: Internal Medicine

## 2014-04-20 DIAGNOSIS — G40909 Epilepsy, unspecified, not intractable, without status epilepticus: Secondary | ICD-10-CM

## 2014-04-20 DIAGNOSIS — E039 Hypothyroidism, unspecified: Secondary | ICD-10-CM

## 2014-04-20 DIAGNOSIS — I10 Essential (primary) hypertension: Secondary | ICD-10-CM

## 2014-04-20 DIAGNOSIS — I890 Lymphedema, not elsewhere classified: Secondary | ICD-10-CM

## 2014-04-20 NOTE — Progress Notes (Signed)
Patient ID: Kristopher RaiderJames Bair, male   DOB: 08/20/1946, 68 y.o.   MRN: [id number]         PROGRESS NOTE  DATE:  04/20/2014  [facility]   LEVEL OF CARE: SNF  Routine Visit  CHIEF COMPLAINT:  Manage lymphedema, hypothyroidism and seizure disorder.    HISTORY OF PRESENT ILLNESS:  REASSESSMENT OF ONGOING PROBLEM(S):  CHRONIC LYMPHEDEMA:  The patient has chronic lymphedema of bilateral lower extremities, left greater than right, which is hereditary edema.  He is overall a poor historian.  Staff do not report increasing lower extremity swelling.    HYPOTHYROIDISM: The hypothyroidism remains stable. No complications noted from the medications presently being used.  The staff denies fatigue or constipation.  Last TSH:  1.91 in 10/2012, in 8-14 TSH 1.1, in 1-15 TSH 1.06, in 5-15 TSH 2.19  SEIZURE DISORDER: The patient's seizure disorder remains stable. No complications reported from the medications presently being used. Staff do not report any recent seizure activity.   PAST MEDICAL HISTORY : Reviewed.  No changes.  [facility]: [facility]  REVIEW OF SYSTEMS:  Difficult to obtain secondary to mental retardation.    PHYSICAL EXAMINATION  VS: See VS section  GENERAL: no acute distress, moderately obese body habitus NECK: supple, trachea midline, no neck masses, no thyroid tenderness, no thyromegaly RESPIRATORY: breathing is even & unlabored, BS CTAB CARDIAC: RRR, no murmur,no extra heart sounds EDEMA/VARICOSITIES:  left lower extremity has +6 edema, right lower extremity has +2 edema ARTERIAL:  pedal pulses nonpalpable  GI: abdomen soft, normal BS, no masses, no tenderness, no hepatomegaly, no splenomegaly PSYCHIATRIC: the patient is alert & disoriented, affect & behavior appropriate  LABS/RADIOLOGY: 6-15 Depakote level 42, liver profile normal, Dilantin level 7.3, albumin 3.7 5-15 CMP normal, hemoglobin 17.8 otherwise CBC normal, Dilantin level 8.3 3-15 PSA 1.1,  vitamin D level 57.8, CBC normal 1-15 fasting lipid panel normal, BMP normal, vitamin B12 level 708, folate level 6.2 11-14 CBC normal 8-14 total protein 5.7, albumin 3.1 otherwise CMP normal, Depakote level 63, Dilantin level 7.6 7-14 HDL 34 otherwise fasting lipid panel normal  6/14 alb 3.4, dilantin level 9.4  12/2012:  CBC normal.   Albumin 3.3.    Dilantin level 7.    11/2012:   TSH 2.2.    10/2012:  Total protein 5.9, albumin 3.4, otherwise CMP normal.    Hemoglobin A1C 5.1.    Depakote level 22.    09/2012:  Fasting lipid panel normal.  ASSESSMENT/PLAN:  Chronic lymphedema.  This is hereditary.  Symptoms are stable.    Hypothyroidism.  Well controlled.     Hypertension.  Well controlled.   Schizophrenia.  Stable.   Seizure disorder.  Well controlled.   BPH.  No issues reported by the staff.    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    Hypokalemia.  Well repleted.  Continue supplementation.    CPT CODE: [id number]  Newton PiggGayani Y. Kerry Doryasanayaka, MD [facility] [phone]

## 2014-04-21 ENCOUNTER — Other Ambulatory Visit: Payer: Self-pay | Admitting: *Deleted

## 2014-04-21 MED ORDER — CLONAZEPAM 0.5 MG PO TABS: 0.5000 mg | ORAL_TABLET | Freq: Three times a day (TID) | ORAL | Status: AC | PRN

## 2014-04-21 NOTE — Telephone Encounter (Signed)
[facility] 

## 2014-04-26 ENCOUNTER — Non-Acute Institutional Stay (SKILLED_NURSING_FACILITY): Payer: Medicare Other | Admitting: Internal Medicine

## 2014-04-26 DIAGNOSIS — I82409 Acute embolism and thrombosis of unspecified deep veins of unspecified lower extremity: Secondary | ICD-10-CM

## 2014-04-26 DIAGNOSIS — I82402 Acute embolism and thrombosis of unspecified deep veins of left lower extremity: Secondary | ICD-10-CM

## 2014-05-02 NOTE — Progress Notes (Signed)
Patient ID: Kristopher Pitts, male   DOB: 02/06/1946, 68 y.o.   MRN: [id number]           PROGRESS NOTE  DATE: 04/26/2014          [facility]  LEVEL OF CARE: SNF (31)  Acute Visit  CHIEF COMPLAINT:  Manage left lower extremity DVT.    HISTORY OF PRESENT ILLNESS: I was requested by the staff to assess the patient regarding above problem(s):  DVT: The DVTs remains stable.  The patient was diagnosed with a left lower extremity DVT in March 2014 and was placed on Xarelto.  I was requested by the pharmacy consultant to assess the patient and consider discontinuation if appropriate.  Patient does have a history of DVT.   Staff denies calf pain, chest pain or shortness of breath.  Patient is currently on anti-coagulation and does not report any complications from that.   Patient is a poor historian.        PAST MEDICAL HISTORY : Reviewed.  No changes/see problem list  CURRENT MEDICATIONS: Reviewed per [facility]/see medication list  REVIEW OF SYSTEMS:  Unobtainable.  Patient is a poor historian.       PHYSICAL EXAMINATION  VS: see VS section  GENERAL: no acute distress, moderately obese body habitus NECK: supple, trachea midline, no neck masses, no thyroid tenderness, no thyromegaly RESPIRATORY: breathing is even & unlabored, BS CTAB CARDIAC: RRR, no murmur,no extra heart sounds, left lower extremity has +6 edema, right lower extremity has +3 edema        GI: abdomen soft, normal BS, no masses, no tenderness, no hepatomegaly, no splenomegaly PSYCHIATRIC: the patient is alert & oriented to person, affect & behavior appropriate  ASSESSMENT/PLAN:  Left lower extremity DVT.  Would continue Xarelto indefinitely given his history of DVT.    CPT CODE: [id number]           Angela CoxGayani Y Yadiel Aubry, MD [facility] [phone]

## 2014-05-23 ENCOUNTER — Non-Acute Institutional Stay (SKILLED_NURSING_FACILITY): Payer: Medicare Other | Admitting: Internal Medicine

## 2014-05-23 DIAGNOSIS — I890 Lymphedema, not elsewhere classified: Secondary | ICD-10-CM

## 2014-05-23 DIAGNOSIS — G40909 Epilepsy, unspecified, not intractable, without status epilepticus: Secondary | ICD-10-CM

## 2014-05-29 NOTE — Progress Notes (Addendum)
Patient ID: Kristopher Pitts, male   DOB: 1946-08-01, 68 y.o.   MRN: [id number]               PROGRESS NOTE  DATE:  05/23/2014    [facility]    LEVEL OF CARE:   SNF   Acute Visit   CHIEF COMPLAINT:  Increasing edema.      HISTORY OF PRESENT ILLNESS:  This is a patient who has been in the facility, I believe, since 2011.  He has a history of seizure disorder, chronic psychiatric illness (schizophrenia).    He has a history of chronic lymphedema of the left greater than right leg.  This is  apparently primary lymphedema.   He has not had any wound issues.  Some of the staff feel that the legs are more swollen and I am reviewing him for this reason.    PAST MEDICAL HISTORY/PROBLEM LIST:  Includes:    Mental retardation.    Schizophrenia.    Bipolar disorder.    COPD.    Lower extremity DVT in the left leg.  He is status post IVC filter.    ?Primary lymphedema in the left leg.    Hypertension.    Hypothyroidism.    Seizure disorder.    BPH.    CURRENT MEDICATIONS:  Medication list is reviewed.     Synthroid 37.5 daily.    Lasix 20 mg x3 for 60 mg daily.    Potassium 10 mEq daily.    Proscar 5 q.d.    Topamax 100 daily.    Cogentin 0.5 daily.    Flomax 0.4 daily.    Ditropan 5 mg daily.    Xarelto 20 mg daily.    Fluphenazine 10 mg by mouth twice daily.    Dilantin 200 b.i.d.    Fanapt 12 mg twice daily.    Valproic acid 750 b.i.d.    REVIEW OF SYSTEMS:  Not really possible from the patient.  I am not certain how aware he is.  He has gained five pounds from July to August, 238 to 243.    PHYSICAL EXAMINATION:   VITAL SIGNS:   O2 SATURATIONS:  95% on room air.   PULSE:  93.   RESPIRATIONS:  18.   GENERAL APPEARANCE:  The patient is not in any distress, lying in bed.     CHEST/RESPIRATORY:   Clear anteriorly.   CARDIOVASCULAR:  CARDIAC:   Heart sounds are normal.  JVP is not elevated.   GASTROINTESTINAL:  ABDOMEN:   Obese.     LIVER/SPLEEN/KIDNEYS:  No liver, no spleen.  No tenderness.   GENITOURINARY:  BLADDER:   Not distended.  There is no obvious tenderness.    CIRCULATION:   EDEMA/VARICOSITIES:  Extremities:  Severe left greater than right lymphedema that extends up into his groin area.  There are also venous stasis changes in the left greater than right leg.  There are no wound care issues here.    LABORATORY DATA:  Recent lab work includes:    LDL 70.    Dilantin level 7.3.    Valproic acid level 42 on 02/28/2014.     Liver function tests normal.    Last BUN and creatinine on 02/22/2014 were 10 and 0.62, respectively.    White count 7, hemoglobin 17.8.    TSH 2.19.     ASSESSMENT/PLAN:  Left greater than right edema.  I think this is mostly lymphedema.  I am reluctant to push the Lasix much  more than the 60 mg he is on.  He is apparently not compliant, with drinking copious amounts of soda out of the machine.  The only think I can think of that would be helpful with the degree of edema would be external compression pumps.  In the meantime, I will attempt to keep his legs elevated.  I will repeat his lab work in early October.    Seizure disorder.  He has had no recent seizures.

## 2014-05-31 ENCOUNTER — Non-Acute Institutional Stay (SKILLED_NURSING_FACILITY): Payer: Medicare Other | Admitting: Internal Medicine

## 2014-05-31 DIAGNOSIS — G40909 Epilepsy, unspecified, not intractable, without status epilepticus: Secondary | ICD-10-CM

## 2014-05-31 DIAGNOSIS — E039 Hypothyroidism, unspecified: Secondary | ICD-10-CM

## 2014-05-31 DIAGNOSIS — I10 Essential (primary) hypertension: Secondary | ICD-10-CM

## 2014-05-31 DIAGNOSIS — I890 Lymphedema, not elsewhere classified: Secondary | ICD-10-CM

## 2014-06-22 NOTE — Progress Notes (Signed)
Patient ID: Kristopher Pitts, male   DOB: [birth year], 68 y.o.   MRN: [id number]         PROGRESS NOTE  DATE:  05/31/2014  [facility]   LEVEL OF CARE: SNF  Routine Visit  CHIEF COMPLAINT:  Manage lymphedema, hypothyroidism and seizure disorder.    HISTORY OF PRESENT ILLNESS:  REASSESSMENT OF ONGOING PROBLEM(S):  CHRONIC LYMPHEDEMA:  The patient has chronic lymphedema of bilateral lower extremities, left greater than right, which is hereditary edema.  He is overall a poor historian.  Staff do not report increasing lower extremity swelling.    HYPOTHYROIDISM: The hypothyroidism remains stable. No complications noted from the medications presently being used.  The staff denies fatigue or constipation.  Last TSH:  1.91 in 10/2012, in 8-14 TSH 1.1, in 1-15 TSH 1.06, in 5-15 TSH 2.19, In 8-15 TSH 2.96  SEIZURE DISORDER: The patient's seizure disorder remains stable. No complications reported from the medications presently being used. Staff do not report any recent seizure activity.   PAST MEDICAL HISTORY : Reviewed.  No changes.  CURRENT MEDICATIONS: Reviewed per [facility]  REVIEW OF SYSTEMS:  Difficult to obtain secondary to mental retardation.    PHYSICAL EXAMINATION  VS: See VS section  GENERAL: no acute distress, moderately obese body habitus EYES: Normal sclerae, normal conjunctivae, no discharge NECK: supple, trachea midline, no neck masses, no thyroid tenderness, no thyromegaly LYMPHATICS: No cervical lymphadenopathy, no supraclavicular lymphadenopathy RESPIRATORY: breathing is even & unlabored, BS CTAB CARDIAC: RRR, no murmur,no extra heart sounds EDEMA/VARICOSITIES:  left lower extremity has +6 edema, right lower extremity has +2 edema ARTERIAL:  pedal pulses nonpalpable  GI: abdomen soft, normal BS, no masses, no tenderness, no hepatomegaly, no splenomegaly PSYCHIATRIC: the patient is alert & disoriented, affect & behavior appropriate  LABS/RADIOLOGY: 8-15 glucose 136,  albumin 3.5 otherwise CMP normal, Depakote level 39, Dilantin level 10.1 7-15 FLP normal 6-15 Depakote level 42, liver profile normal, Dilantin level 7.3, albumin 3.7 5-15 CMP normal, hemoglobin 17.8 otherwise CBC normal, Dilantin level 8.3 3-15 PSA 1.1, vitamin D level 57.8, CBC normal 1-15 fasting lipid panel normal, BMP normal, vitamin B12 level 708, folate level 6.2 11-14 CBC normal 8-14 total protein 5.7, albumin 3.1 otherwise CMP normal, Depakote level 63, Dilantin level 7.6 7-14 HDL 34 otherwise fasting lipid panel normal  6/14 alb 3.4, dilantin level 9.4  12/2012:  CBC normal.   Albumin 3.3.    Dilantin level 7.    11/2012:   TSH 2.2.    10/2012:  Total protein 5.9, albumin 3.4, otherwise CMP normal.    Hemoglobin A1C 5.1.    Depakote level 22.    09/2012:  Fasting lipid panel normal.  ASSESSMENT/PLAN:  Chronic lymphedema.  This is hereditary.  Symptoms are stable.    Hypothyroidism.  Well controlled.     Hypertension.  Well controlled.   Schizophrenia.  Stable.   Seizure disorder.  Well controlled.   BPH.  No issues reported by the staff.    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    Hypokalemia.  Well repleted.  Continue supplementation.    CPT CODE: 78295  Newton Pigg. Kerry Dory, MD [facility] [phone]

## 2014-07-17 ENCOUNTER — Encounter: Payer: Self-pay | Admitting: Internal Medicine

## 2014-07-17 ENCOUNTER — Non-Acute Institutional Stay (SKILLED_NURSING_FACILITY): Payer: Medicare Other | Admitting: Internal Medicine

## 2014-07-17 DIAGNOSIS — I10 Essential (primary) hypertension: Secondary | ICD-10-CM

## 2014-07-17 DIAGNOSIS — I890 Lymphedema, not elsewhere classified: Secondary | ICD-10-CM

## 2014-07-17 DIAGNOSIS — E039 Hypothyroidism, unspecified: Secondary | ICD-10-CM

## 2014-07-17 DIAGNOSIS — G40909 Epilepsy, unspecified, not intractable, without status epilepticus: Secondary | ICD-10-CM

## 2014-07-18 NOTE — Progress Notes (Signed)
This encounter was created in error - please disregard.

## 2014-07-18 NOTE — Progress Notes (Signed)
Patient ID: Kristopher Pitts, male   DOB: 01/29/1946, 68 y.o.   MRN: [id number]         PROGRESS NOTE  DATE:  07/17/2014  [facility]   LEVEL OF CARE: SNF  Routine Visit  CHIEF COMPLAINT:  Manage lymphedema, hypothyroidism and seizure disorder.    HISTORY OF PRESENT ILLNESS:  REASSESSMENT OF ONGOING PROBLEM(S):  CHRONIC LYMPHEDEMA:  The patient has chronic lymphedema of bilateral lower extremities, left greater than right, which is hereditary edema.  He is overall a poor historian.  Staff do not report increasing lower extremity swelling.    HYPOTHYROIDISM: The hypothyroidism remains stable. No complications noted from the medications presently being used.  The staff denies fatigue or constipation.  Last TSH:  1.91 in 10/2012, in 8-14 TSH 1.1, in 1-15 TSH 1.06, in 5-15 TSH 2.19, In 8-15 TSH 2.96  SEIZURE DISORDER: The patient's seizure disorder remains stable. No complications reported from the medications presently being used. Staff do not report any recent seizure activity.   PAST MEDICAL HISTORY : Reviewed.  No changes.  CURRENT MEDICATIONS: [facility]  REVIEW OF SYSTEMS:  Difficult to obtain secondary to mental retardation.    PHYSICAL EXAMINATION  VS: See VS section  GENERAL: no acute distress, moderately obese body habitus EYES: Normal sclerae, normal conjunctivae, no discharge NECK: supple, trachea midline, no neck masses, no thyroid tenderness, no thyromegaly LYMPHATICS: No cervical lymphadenopathy, no supraclavicular lymphadenopathy RESPIRATORY: breathing is even & unlabored, BS CTAB CARDIAC: RRR, no murmur,no extra heart sounds EDEMA/VARICOSITIES:  left lower extremity has +6 edema, right lower extremity has +3 edema ARTERIAL:  pedal pulses nonpalpable  GI: abdomen soft, normal BS, no masses, no tenderness, no hepatomegaly, no splenomegaly PSYCHIATRIC: the patient is alert & disoriented, affect & behavior appropriate  LABS/RADIOLOGY: 10-15 platelets  390 otherwise CBC normal 5-15 platelets 363 8-15 glucose 136, albumin 3.5 otherwise CMP normal, Depakote level 39, Dilantin level 10.1 7-15 FLP normal 6-15 Depakote level 42, liver profile normal, Dilantin level 7.3, albumin 3.7 5-15 CMP normal, hemoglobin 17.8 otherwise CBC normal, Dilantin level 8.3 3-15 PSA 1.1, vitamin D level 57.8, CBC normal 1-15 fasting lipid panel normal, BMP normal, vitamin B12 level 708, folate level 6.2 11-14 CBC normal 8-14 total protein 5.7, albumin 3.1 otherwise CMP normal, Depakote level 63, Dilantin level 7.6 7-14 HDL 34 otherwise fasting lipid panel normal  6/14 alb 3.4, dilantin level 9.4  12/2012:  CBC normal.   Albumin 3.3.    Dilantin level 7.    11/2012:   TSH 2.2.    10/2012:  Total protein 5.9, albumin 3.4, otherwise CMP normal.    Hemoglobin A1C 5.1.    Depakote level 22.    09/2012:  Fasting lipid panel normal.  ASSESSMENT/PLAN:  Chronic lymphedema.  This is hereditary.  Symptoms are stable.    Hypothyroidism.  Well controlled.     Hypertension.  Well controlled.   Schizophrenia.  Stable.   Seizure disorder.  Well controlled.   BPH.  No issues reported by the staff.    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    Hypokalemia.  Well repleted.  Continue supplementation.   Thrombocytosis-new problem. will monitor.   CPT CODE: [id number]  Newton PiggGayani Y. Kerry Doryasanayaka, MD [facility] [phone]

## 2014-08-16 ENCOUNTER — Encounter: Payer: Self-pay | Admitting: Neurology

## 2014-08-22 ENCOUNTER — Encounter: Payer: Self-pay | Admitting: Neurology

## 2015-05-30 ENCOUNTER — Other Ambulatory Visit (HOSPITAL_COMMUNITY): Payer: Self-pay | Admitting: Internal Medicine

## 2015-05-30 DIAGNOSIS — R1314 Dysphagia, pharyngoesophageal phase: Secondary | ICD-10-CM

## 2015-06-06 ENCOUNTER — Ambulatory Visit (HOSPITAL_COMMUNITY)
Admission: RE | Admit: 2015-06-06 | Discharge: 2015-06-06 | Disposition: A | Discharge status: Home / Self Care | Payer: Medicare Other | Source: Ambulatory Visit | Attending: Internal Medicine | Admitting: Internal Medicine

## 2015-06-06 DIAGNOSIS — R1314 Dysphagia, pharyngoesophageal phase: Secondary | ICD-10-CM | POA: Insufficient documentation

## 2015-06-06 NOTE — Progress Notes (Signed)
   06/06/15 1200  SLP G-Codes **NOT FOR INPATIENT CLASS**  Functional Assessment Tool Used (clinical judgement)  Functional Limitations Swallowing  Swallow Current Status (Z6109) CK  Swallow Goal Status (U0454) CK  Swallow Discharge Status (U9811) CK  SLP Evaluations  $ SLP Speech Visit 1 Procedure  SLP Evaluations  $Swallowing Treatment 1 Procedure  $MBS Swallow Outpatient 1 Procedure

## 2016-02-15 ENCOUNTER — Other Ambulatory Visit: Payer: Self-pay | Admitting: Internal Medicine

## 2016-02-15 ENCOUNTER — Ambulatory Visit
Admission: RE | Admit: 2016-02-15 | Discharge: 2016-02-15 | Disposition: A | Discharge status: Home / Self Care | Payer: Medicare Other | Source: Ambulatory Visit | Attending: Internal Medicine | Admitting: Internal Medicine

## 2016-02-15 DIAGNOSIS — I890 Lymphedema, not elsewhere classified: Secondary | ICD-10-CM

## 2016-10-17 DIAGNOSIS — I10 Essential (primary) hypertension: Secondary | ICD-10-CM | POA: Diagnosis not present

## 2016-10-17 DIAGNOSIS — J449 Chronic obstructive pulmonary disease, unspecified: Secondary | ICD-10-CM | POA: Diagnosis not present

## 2016-10-17 DIAGNOSIS — E46 Unspecified protein-calorie malnutrition: Secondary | ICD-10-CM | POA: Diagnosis not present

## 2016-10-17 DIAGNOSIS — F319 Bipolar disorder, unspecified: Secondary | ICD-10-CM | POA: Diagnosis not present

## 2016-10-17 DIAGNOSIS — N400 Enlarged prostate without lower urinary tract symptoms: Secondary | ICD-10-CM | POA: Diagnosis not present

## 2016-10-17 DIAGNOSIS — R569 Unspecified convulsions: Secondary | ICD-10-CM | POA: Diagnosis not present

## 2016-10-17 DIAGNOSIS — I890 Lymphedema, not elsewhere classified: Secondary | ICD-10-CM | POA: Diagnosis not present

## 2016-10-17 DIAGNOSIS — F0391 Unspecified dementia with behavioral disturbance: Secondary | ICD-10-CM | POA: Diagnosis not present

## 2016-10-17 DIAGNOSIS — F209 Schizophrenia, unspecified: Secondary | ICD-10-CM | POA: Diagnosis not present

## 2016-10-28 DIAGNOSIS — I10 Essential (primary) hypertension: Secondary | ICD-10-CM | POA: Diagnosis not present

## 2016-10-28 DIAGNOSIS — I890 Lymphedema, not elsewhere classified: Secondary | ICD-10-CM | POA: Diagnosis not present

## 2016-10-28 DIAGNOSIS — I82409 Acute embolism and thrombosis of unspecified deep veins of unspecified lower extremity: Secondary | ICD-10-CM | POA: Diagnosis not present

## 2016-10-28 DIAGNOSIS — R1312 Dysphagia, oropharyngeal phase: Secondary | ICD-10-CM | POA: Diagnosis not present

## 2016-10-29 DIAGNOSIS — R1312 Dysphagia, oropharyngeal phase: Secondary | ICD-10-CM | POA: Diagnosis not present

## 2016-10-29 DIAGNOSIS — I890 Lymphedema, not elsewhere classified: Secondary | ICD-10-CM | POA: Diagnosis not present

## 2016-10-29 DIAGNOSIS — I82409 Acute embolism and thrombosis of unspecified deep veins of unspecified lower extremity: Secondary | ICD-10-CM | POA: Diagnosis not present

## 2016-10-29 DIAGNOSIS — I10 Essential (primary) hypertension: Secondary | ICD-10-CM | POA: Diagnosis not present

## 2016-10-30 DIAGNOSIS — I890 Lymphedema, not elsewhere classified: Secondary | ICD-10-CM | POA: Diagnosis not present

## 2016-10-30 DIAGNOSIS — I82409 Acute embolism and thrombosis of unspecified deep veins of unspecified lower extremity: Secondary | ICD-10-CM | POA: Diagnosis not present

## 2016-10-30 DIAGNOSIS — R1312 Dysphagia, oropharyngeal phase: Secondary | ICD-10-CM | POA: Diagnosis not present

## 2016-10-30 DIAGNOSIS — I10 Essential (primary) hypertension: Secondary | ICD-10-CM | POA: Diagnosis not present

## 2016-10-31 DIAGNOSIS — I82409 Acute embolism and thrombosis of unspecified deep veins of unspecified lower extremity: Secondary | ICD-10-CM | POA: Diagnosis not present

## 2016-10-31 DIAGNOSIS — I10 Essential (primary) hypertension: Secondary | ICD-10-CM | POA: Diagnosis not present

## 2016-10-31 DIAGNOSIS — R1312 Dysphagia, oropharyngeal phase: Secondary | ICD-10-CM | POA: Diagnosis not present

## 2016-10-31 DIAGNOSIS — I890 Lymphedema, not elsewhere classified: Secondary | ICD-10-CM | POA: Diagnosis not present

## 2016-11-03 DIAGNOSIS — I10 Essential (primary) hypertension: Secondary | ICD-10-CM | POA: Diagnosis not present

## 2016-11-03 DIAGNOSIS — I890 Lymphedema, not elsewhere classified: Secondary | ICD-10-CM | POA: Diagnosis not present

## 2016-11-03 DIAGNOSIS — I82409 Acute embolism and thrombosis of unspecified deep veins of unspecified lower extremity: Secondary | ICD-10-CM | POA: Diagnosis not present

## 2016-11-03 DIAGNOSIS — R1312 Dysphagia, oropharyngeal phase: Secondary | ICD-10-CM | POA: Diagnosis not present

## 2016-11-04 DIAGNOSIS — I10 Essential (primary) hypertension: Secondary | ICD-10-CM | POA: Diagnosis not present

## 2016-11-04 DIAGNOSIS — I82409 Acute embolism and thrombosis of unspecified deep veins of unspecified lower extremity: Secondary | ICD-10-CM | POA: Diagnosis not present

## 2016-11-04 DIAGNOSIS — R1312 Dysphagia, oropharyngeal phase: Secondary | ICD-10-CM | POA: Diagnosis not present

## 2016-11-04 DIAGNOSIS — I890 Lymphedema, not elsewhere classified: Secondary | ICD-10-CM | POA: Diagnosis not present

## 2016-11-06 DIAGNOSIS — F3164 Bipolar disorder, current episode mixed, severe, with psychotic features: Secondary | ICD-10-CM | POA: Diagnosis not present

## 2016-11-06 DIAGNOSIS — G259 Extrapyramidal and movement disorder, unspecified: Secondary | ICD-10-CM | POA: Diagnosis not present

## 2016-11-06 DIAGNOSIS — F258 Other schizoaffective disorders: Secondary | ICD-10-CM | POA: Diagnosis not present

## 2016-11-06 DIAGNOSIS — I82409 Acute embolism and thrombosis of unspecified deep veins of unspecified lower extremity: Secondary | ICD-10-CM | POA: Diagnosis not present

## 2016-11-06 DIAGNOSIS — R1312 Dysphagia, oropharyngeal phase: Secondary | ICD-10-CM | POA: Diagnosis not present

## 2016-11-06 DIAGNOSIS — I10 Essential (primary) hypertension: Secondary | ICD-10-CM | POA: Diagnosis not present

## 2016-11-06 DIAGNOSIS — I890 Lymphedema, not elsewhere classified: Secondary | ICD-10-CM | POA: Diagnosis not present

## 2016-11-06 DIAGNOSIS — F411 Generalized anxiety disorder: Secondary | ICD-10-CM | POA: Diagnosis not present

## 2016-11-11 DIAGNOSIS — I82409 Acute embolism and thrombosis of unspecified deep veins of unspecified lower extremity: Secondary | ICD-10-CM | POA: Diagnosis not present

## 2016-11-11 DIAGNOSIS — R1312 Dysphagia, oropharyngeal phase: Secondary | ICD-10-CM | POA: Diagnosis not present

## 2016-11-11 DIAGNOSIS — I10 Essential (primary) hypertension: Secondary | ICD-10-CM | POA: Diagnosis not present

## 2016-11-11 DIAGNOSIS — I890 Lymphedema, not elsewhere classified: Secondary | ICD-10-CM | POA: Diagnosis not present

## 2016-11-12 DIAGNOSIS — R1312 Dysphagia, oropharyngeal phase: Secondary | ICD-10-CM | POA: Diagnosis not present

## 2016-11-12 DIAGNOSIS — I890 Lymphedema, not elsewhere classified: Secondary | ICD-10-CM | POA: Diagnosis not present

## 2016-11-12 DIAGNOSIS — I10 Essential (primary) hypertension: Secondary | ICD-10-CM | POA: Diagnosis not present

## 2016-11-12 DIAGNOSIS — I82409 Acute embolism and thrombosis of unspecified deep veins of unspecified lower extremity: Secondary | ICD-10-CM | POA: Diagnosis not present

## 2016-11-13 DIAGNOSIS — J449 Chronic obstructive pulmonary disease, unspecified: Secondary | ICD-10-CM | POA: Diagnosis not present

## 2016-11-13 DIAGNOSIS — E039 Hypothyroidism, unspecified: Secondary | ICD-10-CM | POA: Diagnosis not present

## 2016-11-13 DIAGNOSIS — R569 Unspecified convulsions: Secondary | ICD-10-CM | POA: Diagnosis not present

## 2016-11-13 DIAGNOSIS — I10 Essential (primary) hypertension: Secondary | ICD-10-CM | POA: Diagnosis not present

## 2016-11-13 DIAGNOSIS — E46 Unspecified protein-calorie malnutrition: Secondary | ICD-10-CM | POA: Diagnosis not present

## 2016-11-13 DIAGNOSIS — N400 Enlarged prostate without lower urinary tract symptoms: Secondary | ICD-10-CM | POA: Diagnosis not present

## 2016-11-13 DIAGNOSIS — F0391 Unspecified dementia with behavioral disturbance: Secondary | ICD-10-CM | POA: Diagnosis not present

## 2016-11-13 DIAGNOSIS — F250 Schizoaffective disorder, bipolar type: Secondary | ICD-10-CM | POA: Diagnosis not present

## 2016-11-13 DIAGNOSIS — I890 Lymphedema, not elsewhere classified: Secondary | ICD-10-CM | POA: Diagnosis not present

## 2016-11-14 DIAGNOSIS — I10 Essential (primary) hypertension: Secondary | ICD-10-CM | POA: Diagnosis not present

## 2016-11-14 DIAGNOSIS — I890 Lymphedema, not elsewhere classified: Secondary | ICD-10-CM | POA: Diagnosis not present

## 2016-11-14 DIAGNOSIS — R1312 Dysphagia, oropharyngeal phase: Secondary | ICD-10-CM | POA: Diagnosis not present

## 2016-11-14 DIAGNOSIS — I82409 Acute embolism and thrombosis of unspecified deep veins of unspecified lower extremity: Secondary | ICD-10-CM | POA: Diagnosis not present

## 2016-12-02 DIAGNOSIS — R627 Adult failure to thrive: Secondary | ICD-10-CM | POA: Diagnosis not present

## 2016-12-11 DIAGNOSIS — J449 Chronic obstructive pulmonary disease, unspecified: Secondary | ICD-10-CM | POA: Diagnosis not present

## 2016-12-11 DIAGNOSIS — R569 Unspecified convulsions: Secondary | ICD-10-CM | POA: Diagnosis not present

## 2016-12-11 DIAGNOSIS — I10 Essential (primary) hypertension: Secondary | ICD-10-CM | POA: Diagnosis not present

## 2016-12-11 DIAGNOSIS — N182 Chronic kidney disease, stage 2 (mild): Secondary | ICD-10-CM | POA: Diagnosis not present

## 2016-12-11 DIAGNOSIS — F329 Major depressive disorder, single episode, unspecified: Secondary | ICD-10-CM | POA: Diagnosis not present

## 2016-12-11 DIAGNOSIS — F250 Schizoaffective disorder, bipolar type: Secondary | ICD-10-CM | POA: Diagnosis not present

## 2016-12-11 DIAGNOSIS — F0391 Unspecified dementia with behavioral disturbance: Secondary | ICD-10-CM | POA: Diagnosis not present

## 2017-01-14 DIAGNOSIS — F3164 Bipolar disorder, current episode mixed, severe, with psychotic features: Secondary | ICD-10-CM | POA: Diagnosis not present

## 2017-01-14 DIAGNOSIS — F411 Generalized anxiety disorder: Secondary | ICD-10-CM | POA: Diagnosis not present

## 2017-01-14 DIAGNOSIS — G259 Extrapyramidal and movement disorder, unspecified: Secondary | ICD-10-CM | POA: Diagnosis not present

## 2017-01-14 DIAGNOSIS — F258 Other schizoaffective disorders: Secondary | ICD-10-CM | POA: Diagnosis not present

## 2017-01-15 DIAGNOSIS — F209 Schizophrenia, unspecified: Secondary | ICD-10-CM | POA: Diagnosis not present

## 2017-01-15 DIAGNOSIS — R569 Unspecified convulsions: Secondary | ICD-10-CM | POA: Diagnosis not present

## 2017-01-15 DIAGNOSIS — F0390 Unspecified dementia without behavioral disturbance: Secondary | ICD-10-CM | POA: Diagnosis not present

## 2017-01-15 DIAGNOSIS — I890 Lymphedema, not elsewhere classified: Secondary | ICD-10-CM | POA: Diagnosis not present

## 2017-01-15 DIAGNOSIS — F319 Bipolar disorder, unspecified: Secondary | ICD-10-CM | POA: Diagnosis not present

## 2017-01-15 DIAGNOSIS — J449 Chronic obstructive pulmonary disease, unspecified: Secondary | ICD-10-CM | POA: Diagnosis not present

## 2017-01-15 DIAGNOSIS — I10 Essential (primary) hypertension: Secondary | ICD-10-CM | POA: Diagnosis not present

## 2017-01-15 DIAGNOSIS — D696 Thrombocytopenia, unspecified: Secondary | ICD-10-CM | POA: Diagnosis not present

## 2017-01-15 DIAGNOSIS — N400 Enlarged prostate without lower urinary tract symptoms: Secondary | ICD-10-CM | POA: Diagnosis not present

## 2017-01-15 DIAGNOSIS — I82409 Acute embolism and thrombosis of unspecified deep veins of unspecified lower extremity: Secondary | ICD-10-CM | POA: Diagnosis not present

## 2017-02-04 DIAGNOSIS — Z79899 Other long term (current) drug therapy: Secondary | ICD-10-CM | POA: Diagnosis not present

## 2017-02-04 DIAGNOSIS — E039 Hypothyroidism, unspecified: Secondary | ICD-10-CM | POA: Diagnosis not present

## 2017-02-04 DIAGNOSIS — N289 Disorder of kidney and ureter, unspecified: Secondary | ICD-10-CM | POA: Diagnosis not present

## 2017-02-04 DIAGNOSIS — R569 Unspecified convulsions: Secondary | ICD-10-CM | POA: Diagnosis not present

## 2017-02-04 DIAGNOSIS — D649 Anemia, unspecified: Secondary | ICD-10-CM | POA: Diagnosis not present

## 2017-02-12 DIAGNOSIS — F0391 Unspecified dementia with behavioral disturbance: Secondary | ICD-10-CM | POA: Diagnosis not present

## 2017-02-12 DIAGNOSIS — E039 Hypothyroidism, unspecified: Secondary | ICD-10-CM | POA: Diagnosis not present

## 2017-02-12 DIAGNOSIS — F209 Schizophrenia, unspecified: Secondary | ICD-10-CM | POA: Diagnosis not present

## 2017-02-12 DIAGNOSIS — I890 Lymphedema, not elsewhere classified: Secondary | ICD-10-CM | POA: Diagnosis not present

## 2017-02-12 DIAGNOSIS — J449 Chronic obstructive pulmonary disease, unspecified: Secondary | ICD-10-CM | POA: Diagnosis not present

## 2017-02-12 DIAGNOSIS — H00016 Hordeolum externum left eye, unspecified eyelid: Secondary | ICD-10-CM | POA: Diagnosis not present

## 2017-02-12 DIAGNOSIS — F319 Bipolar disorder, unspecified: Secondary | ICD-10-CM | POA: Diagnosis not present

## 2017-02-12 DIAGNOSIS — R569 Unspecified convulsions: Secondary | ICD-10-CM | POA: Diagnosis not present

## 2017-02-12 DIAGNOSIS — D696 Thrombocytopenia, unspecified: Secondary | ICD-10-CM | POA: Diagnosis not present

## 2017-03-12 DIAGNOSIS — R531 Weakness: Secondary | ICD-10-CM | POA: Diagnosis not present

## 2017-03-17 DIAGNOSIS — R293 Abnormal posture: Secondary | ICD-10-CM | POA: Diagnosis not present

## 2017-03-17 DIAGNOSIS — M6281 Muscle weakness (generalized): Secondary | ICD-10-CM | POA: Diagnosis not present

## 2017-03-17 DIAGNOSIS — R278 Other lack of coordination: Secondary | ICD-10-CM | POA: Diagnosis not present

## 2017-03-17 DIAGNOSIS — F319 Bipolar disorder, unspecified: Secondary | ICD-10-CM | POA: Diagnosis not present

## 2017-03-18 DIAGNOSIS — F319 Bipolar disorder, unspecified: Secondary | ICD-10-CM | POA: Diagnosis not present

## 2017-03-18 DIAGNOSIS — M6281 Muscle weakness (generalized): Secondary | ICD-10-CM | POA: Diagnosis not present

## 2017-03-18 DIAGNOSIS — R293 Abnormal posture: Secondary | ICD-10-CM | POA: Diagnosis not present

## 2017-03-18 DIAGNOSIS — R278 Other lack of coordination: Secondary | ICD-10-CM | POA: Diagnosis not present

## 2017-03-19 DIAGNOSIS — E46 Unspecified protein-calorie malnutrition: Secondary | ICD-10-CM | POA: Diagnosis not present

## 2017-03-19 DIAGNOSIS — I82409 Acute embolism and thrombosis of unspecified deep veins of unspecified lower extremity: Secondary | ICD-10-CM | POA: Diagnosis not present

## 2017-03-19 DIAGNOSIS — F319 Bipolar disorder, unspecified: Secondary | ICD-10-CM | POA: Diagnosis not present

## 2017-03-19 DIAGNOSIS — N400 Enlarged prostate without lower urinary tract symptoms: Secondary | ICD-10-CM | POA: Diagnosis not present

## 2017-03-19 DIAGNOSIS — D696 Thrombocytopenia, unspecified: Secondary | ICD-10-CM | POA: Diagnosis not present

## 2017-03-19 DIAGNOSIS — R569 Unspecified convulsions: Secondary | ICD-10-CM | POA: Diagnosis not present

## 2017-03-19 DIAGNOSIS — J449 Chronic obstructive pulmonary disease, unspecified: Secondary | ICD-10-CM | POA: Diagnosis not present

## 2017-03-19 DIAGNOSIS — F0391 Unspecified dementia with behavioral disturbance: Secondary | ICD-10-CM | POA: Diagnosis not present

## 2017-03-19 DIAGNOSIS — F209 Schizophrenia, unspecified: Secondary | ICD-10-CM | POA: Diagnosis not present

## 2017-03-19 DIAGNOSIS — I890 Lymphedema, not elsewhere classified: Secondary | ICD-10-CM | POA: Diagnosis not present

## 2017-03-20 DIAGNOSIS — R293 Abnormal posture: Secondary | ICD-10-CM | POA: Diagnosis not present

## 2017-03-20 DIAGNOSIS — F319 Bipolar disorder, unspecified: Secondary | ICD-10-CM | POA: Diagnosis not present

## 2017-03-20 DIAGNOSIS — M6281 Muscle weakness (generalized): Secondary | ICD-10-CM | POA: Diagnosis not present

## 2017-03-20 DIAGNOSIS — R278 Other lack of coordination: Secondary | ICD-10-CM | POA: Diagnosis not present

## 2017-03-23 DIAGNOSIS — M6281 Muscle weakness (generalized): Secondary | ICD-10-CM | POA: Diagnosis not present

## 2017-03-23 DIAGNOSIS — R293 Abnormal posture: Secondary | ICD-10-CM | POA: Diagnosis not present

## 2017-03-23 DIAGNOSIS — R278 Other lack of coordination: Secondary | ICD-10-CM | POA: Diagnosis not present

## 2017-03-23 DIAGNOSIS — F319 Bipolar disorder, unspecified: Secondary | ICD-10-CM | POA: Diagnosis not present

## 2017-03-24 DIAGNOSIS — R293 Abnormal posture: Secondary | ICD-10-CM | POA: Diagnosis not present

## 2017-03-24 DIAGNOSIS — I739 Peripheral vascular disease, unspecified: Secondary | ICD-10-CM | POA: Diagnosis not present

## 2017-03-24 DIAGNOSIS — B351 Tinea unguium: Secondary | ICD-10-CM | POA: Diagnosis not present

## 2017-03-24 DIAGNOSIS — R278 Other lack of coordination: Secondary | ICD-10-CM | POA: Diagnosis not present

## 2017-03-24 DIAGNOSIS — M6281 Muscle weakness (generalized): Secondary | ICD-10-CM | POA: Diagnosis not present

## 2017-03-24 DIAGNOSIS — R600 Localized edema: Secondary | ICD-10-CM | POA: Diagnosis not present

## 2017-03-24 DIAGNOSIS — F319 Bipolar disorder, unspecified: Secondary | ICD-10-CM | POA: Diagnosis not present

## 2017-03-25 DIAGNOSIS — R293 Abnormal posture: Secondary | ICD-10-CM | POA: Diagnosis not present

## 2017-03-25 DIAGNOSIS — R278 Other lack of coordination: Secondary | ICD-10-CM | POA: Diagnosis not present

## 2017-03-25 DIAGNOSIS — M6281 Muscle weakness (generalized): Secondary | ICD-10-CM | POA: Diagnosis not present

## 2017-03-25 DIAGNOSIS — F319 Bipolar disorder, unspecified: Secondary | ICD-10-CM | POA: Diagnosis not present

## 2017-03-26 DIAGNOSIS — R278 Other lack of coordination: Secondary | ICD-10-CM | POA: Diagnosis not present

## 2017-03-26 DIAGNOSIS — M6281 Muscle weakness (generalized): Secondary | ICD-10-CM | POA: Diagnosis not present

## 2017-03-26 DIAGNOSIS — F319 Bipolar disorder, unspecified: Secondary | ICD-10-CM | POA: Diagnosis not present

## 2017-03-26 DIAGNOSIS — R293 Abnormal posture: Secondary | ICD-10-CM | POA: Diagnosis not present

## 2017-03-28 DIAGNOSIS — R293 Abnormal posture: Secondary | ICD-10-CM | POA: Diagnosis not present

## 2017-03-28 DIAGNOSIS — R278 Other lack of coordination: Secondary | ICD-10-CM | POA: Diagnosis not present

## 2017-03-28 DIAGNOSIS — M6281 Muscle weakness (generalized): Secondary | ICD-10-CM | POA: Diagnosis not present

## 2017-03-28 DIAGNOSIS — F319 Bipolar disorder, unspecified: Secondary | ICD-10-CM | POA: Diagnosis not present

## 2017-03-30 DIAGNOSIS — M6281 Muscle weakness (generalized): Secondary | ICD-10-CM | POA: Diagnosis not present

## 2017-03-30 DIAGNOSIS — R278 Other lack of coordination: Secondary | ICD-10-CM | POA: Diagnosis not present

## 2017-03-30 DIAGNOSIS — F319 Bipolar disorder, unspecified: Secondary | ICD-10-CM | POA: Diagnosis not present

## 2017-03-30 DIAGNOSIS — R293 Abnormal posture: Secondary | ICD-10-CM | POA: Diagnosis not present

## 2017-04-01 DIAGNOSIS — F319 Bipolar disorder, unspecified: Secondary | ICD-10-CM | POA: Diagnosis not present

## 2017-04-01 DIAGNOSIS — R278 Other lack of coordination: Secondary | ICD-10-CM | POA: Diagnosis not present

## 2017-04-01 DIAGNOSIS — R293 Abnormal posture: Secondary | ICD-10-CM | POA: Diagnosis not present

## 2017-04-01 DIAGNOSIS — M6281 Muscle weakness (generalized): Secondary | ICD-10-CM | POA: Diagnosis not present

## 2017-04-04 DIAGNOSIS — M6281 Muscle weakness (generalized): Secondary | ICD-10-CM | POA: Diagnosis not present

## 2017-04-04 DIAGNOSIS — F319 Bipolar disorder, unspecified: Secondary | ICD-10-CM | POA: Diagnosis not present

## 2017-04-04 DIAGNOSIS — R278 Other lack of coordination: Secondary | ICD-10-CM | POA: Diagnosis not present

## 2017-04-04 DIAGNOSIS — R293 Abnormal posture: Secondary | ICD-10-CM | POA: Diagnosis not present

## 2017-04-06 DIAGNOSIS — R293 Abnormal posture: Secondary | ICD-10-CM | POA: Diagnosis not present

## 2017-04-06 DIAGNOSIS — F319 Bipolar disorder, unspecified: Secondary | ICD-10-CM | POA: Diagnosis not present

## 2017-04-06 DIAGNOSIS — R278 Other lack of coordination: Secondary | ICD-10-CM | POA: Diagnosis not present

## 2017-04-06 DIAGNOSIS — M6281 Muscle weakness (generalized): Secondary | ICD-10-CM | POA: Diagnosis not present

## 2017-04-08 DIAGNOSIS — M6281 Muscle weakness (generalized): Secondary | ICD-10-CM | POA: Diagnosis not present

## 2017-04-08 DIAGNOSIS — R278 Other lack of coordination: Secondary | ICD-10-CM | POA: Diagnosis not present

## 2017-04-08 DIAGNOSIS — R293 Abnormal posture: Secondary | ICD-10-CM | POA: Diagnosis not present

## 2017-04-08 DIAGNOSIS — F319 Bipolar disorder, unspecified: Secondary | ICD-10-CM | POA: Diagnosis not present

## 2017-04-09 DIAGNOSIS — R278 Other lack of coordination: Secondary | ICD-10-CM | POA: Diagnosis not present

## 2017-04-09 DIAGNOSIS — F319 Bipolar disorder, unspecified: Secondary | ICD-10-CM | POA: Diagnosis not present

## 2017-04-09 DIAGNOSIS — R293 Abnormal posture: Secondary | ICD-10-CM | POA: Diagnosis not present

## 2017-04-09 DIAGNOSIS — M6281 Muscle weakness (generalized): Secondary | ICD-10-CM | POA: Diagnosis not present

## 2017-04-13 DIAGNOSIS — R293 Abnormal posture: Secondary | ICD-10-CM | POA: Diagnosis not present

## 2017-04-13 DIAGNOSIS — M6281 Muscle weakness (generalized): Secondary | ICD-10-CM | POA: Diagnosis not present

## 2017-04-13 DIAGNOSIS — R278 Other lack of coordination: Secondary | ICD-10-CM | POA: Diagnosis not present

## 2017-04-13 DIAGNOSIS — F319 Bipolar disorder, unspecified: Secondary | ICD-10-CM | POA: Diagnosis not present

## 2017-04-15 DIAGNOSIS — F319 Bipolar disorder, unspecified: Secondary | ICD-10-CM | POA: Diagnosis not present

## 2017-04-15 DIAGNOSIS — M6281 Muscle weakness (generalized): Secondary | ICD-10-CM | POA: Diagnosis not present

## 2017-04-15 DIAGNOSIS — R293 Abnormal posture: Secondary | ICD-10-CM | POA: Diagnosis not present

## 2017-04-15 DIAGNOSIS — R278 Other lack of coordination: Secondary | ICD-10-CM | POA: Diagnosis not present

## 2017-04-16 DIAGNOSIS — F258 Other schizoaffective disorders: Secondary | ICD-10-CM | POA: Diagnosis not present

## 2017-04-16 DIAGNOSIS — F319 Bipolar disorder, unspecified: Secondary | ICD-10-CM | POA: Diagnosis not present

## 2017-04-16 DIAGNOSIS — F411 Generalized anxiety disorder: Secondary | ICD-10-CM | POA: Diagnosis not present

## 2017-04-16 DIAGNOSIS — G259 Extrapyramidal and movement disorder, unspecified: Secondary | ICD-10-CM | POA: Diagnosis not present

## 2017-04-16 DIAGNOSIS — F3164 Bipolar disorder, current episode mixed, severe, with psychotic features: Secondary | ICD-10-CM | POA: Diagnosis not present

## 2017-04-16 DIAGNOSIS — R278 Other lack of coordination: Secondary | ICD-10-CM | POA: Diagnosis not present

## 2017-04-16 DIAGNOSIS — M6281 Muscle weakness (generalized): Secondary | ICD-10-CM | POA: Diagnosis not present

## 2017-04-16 DIAGNOSIS — R293 Abnormal posture: Secondary | ICD-10-CM | POA: Diagnosis not present

## 2017-04-21 DIAGNOSIS — F319 Bipolar disorder, unspecified: Secondary | ICD-10-CM | POA: Diagnosis not present

## 2017-04-21 DIAGNOSIS — R278 Other lack of coordination: Secondary | ICD-10-CM | POA: Diagnosis not present

## 2017-04-21 DIAGNOSIS — R293 Abnormal posture: Secondary | ICD-10-CM | POA: Diagnosis not present

## 2017-04-21 DIAGNOSIS — M6281 Muscle weakness (generalized): Secondary | ICD-10-CM | POA: Diagnosis not present

## 2017-04-22 DIAGNOSIS — R293 Abnormal posture: Secondary | ICD-10-CM | POA: Diagnosis not present

## 2017-04-22 DIAGNOSIS — R278 Other lack of coordination: Secondary | ICD-10-CM | POA: Diagnosis not present

## 2017-04-22 DIAGNOSIS — F319 Bipolar disorder, unspecified: Secondary | ICD-10-CM | POA: Diagnosis not present

## 2017-04-22 DIAGNOSIS — M6281 Muscle weakness (generalized): Secondary | ICD-10-CM | POA: Diagnosis not present

## 2017-04-23 DIAGNOSIS — R278 Other lack of coordination: Secondary | ICD-10-CM | POA: Diagnosis not present

## 2017-04-23 DIAGNOSIS — M6281 Muscle weakness (generalized): Secondary | ICD-10-CM | POA: Diagnosis not present

## 2017-04-23 DIAGNOSIS — F319 Bipolar disorder, unspecified: Secondary | ICD-10-CM | POA: Diagnosis not present

## 2017-04-23 DIAGNOSIS — R293 Abnormal posture: Secondary | ICD-10-CM | POA: Diagnosis not present

## 2017-04-27 DIAGNOSIS — R293 Abnormal posture: Secondary | ICD-10-CM | POA: Diagnosis not present

## 2017-04-27 DIAGNOSIS — M6281 Muscle weakness (generalized): Secondary | ICD-10-CM | POA: Diagnosis not present

## 2017-04-27 DIAGNOSIS — F319 Bipolar disorder, unspecified: Secondary | ICD-10-CM | POA: Diagnosis not present

## 2017-04-27 DIAGNOSIS — R278 Other lack of coordination: Secondary | ICD-10-CM | POA: Diagnosis not present

## 2017-04-29 DIAGNOSIS — R293 Abnormal posture: Secondary | ICD-10-CM | POA: Diagnosis not present

## 2017-04-29 DIAGNOSIS — F319 Bipolar disorder, unspecified: Secondary | ICD-10-CM | POA: Diagnosis not present

## 2017-04-29 DIAGNOSIS — M6281 Muscle weakness (generalized): Secondary | ICD-10-CM | POA: Diagnosis not present

## 2017-04-29 DIAGNOSIS — R278 Other lack of coordination: Secondary | ICD-10-CM | POA: Diagnosis not present

## 2017-04-30 DIAGNOSIS — R278 Other lack of coordination: Secondary | ICD-10-CM | POA: Diagnosis not present

## 2017-04-30 DIAGNOSIS — M6281 Muscle weakness (generalized): Secondary | ICD-10-CM | POA: Diagnosis not present

## 2017-04-30 DIAGNOSIS — R293 Abnormal posture: Secondary | ICD-10-CM | POA: Diagnosis not present

## 2017-04-30 DIAGNOSIS — F319 Bipolar disorder, unspecified: Secondary | ICD-10-CM | POA: Diagnosis not present

## 2017-05-04 DIAGNOSIS — M6281 Muscle weakness (generalized): Secondary | ICD-10-CM | POA: Diagnosis not present

## 2017-05-04 DIAGNOSIS — R293 Abnormal posture: Secondary | ICD-10-CM | POA: Diagnosis not present

## 2017-05-04 DIAGNOSIS — R278 Other lack of coordination: Secondary | ICD-10-CM | POA: Diagnosis not present

## 2017-05-04 DIAGNOSIS — F319 Bipolar disorder, unspecified: Secondary | ICD-10-CM | POA: Diagnosis not present

## 2017-05-05 DIAGNOSIS — M6281 Muscle weakness (generalized): Secondary | ICD-10-CM | POA: Diagnosis not present

## 2017-05-05 DIAGNOSIS — R293 Abnormal posture: Secondary | ICD-10-CM | POA: Diagnosis not present

## 2017-05-05 DIAGNOSIS — R278 Other lack of coordination: Secondary | ICD-10-CM | POA: Diagnosis not present

## 2017-05-05 DIAGNOSIS — F319 Bipolar disorder, unspecified: Secondary | ICD-10-CM | POA: Diagnosis not present

## 2017-05-06 DIAGNOSIS — R293 Abnormal posture: Secondary | ICD-10-CM | POA: Diagnosis not present

## 2017-05-06 DIAGNOSIS — R278 Other lack of coordination: Secondary | ICD-10-CM | POA: Diagnosis not present

## 2017-05-06 DIAGNOSIS — M6281 Muscle weakness (generalized): Secondary | ICD-10-CM | POA: Diagnosis not present

## 2017-05-06 DIAGNOSIS — F319 Bipolar disorder, unspecified: Secondary | ICD-10-CM | POA: Diagnosis not present

## 2017-05-07 DIAGNOSIS — F319 Bipolar disorder, unspecified: Secondary | ICD-10-CM | POA: Diagnosis not present

## 2017-05-07 DIAGNOSIS — R278 Other lack of coordination: Secondary | ICD-10-CM | POA: Diagnosis not present

## 2017-05-07 DIAGNOSIS — M6281 Muscle weakness (generalized): Secondary | ICD-10-CM | POA: Diagnosis not present

## 2017-05-07 DIAGNOSIS — R293 Abnormal posture: Secondary | ICD-10-CM | POA: Diagnosis not present

## 2017-05-08 DIAGNOSIS — R278 Other lack of coordination: Secondary | ICD-10-CM | POA: Diagnosis not present

## 2017-05-08 DIAGNOSIS — M6281 Muscle weakness (generalized): Secondary | ICD-10-CM | POA: Diagnosis not present

## 2017-05-08 DIAGNOSIS — F319 Bipolar disorder, unspecified: Secondary | ICD-10-CM | POA: Diagnosis not present

## 2017-05-08 DIAGNOSIS — R293 Abnormal posture: Secondary | ICD-10-CM | POA: Diagnosis not present

## 2017-05-14 DIAGNOSIS — I10 Essential (primary) hypertension: Secondary | ICD-10-CM | POA: Diagnosis not present

## 2017-05-14 DIAGNOSIS — I890 Lymphedema, not elsewhere classified: Secondary | ICD-10-CM | POA: Diagnosis not present

## 2017-05-14 DIAGNOSIS — F0391 Unspecified dementia with behavioral disturbance: Secondary | ICD-10-CM | POA: Diagnosis not present

## 2017-05-14 DIAGNOSIS — R569 Unspecified convulsions: Secondary | ICD-10-CM | POA: Diagnosis not present

## 2017-05-14 DIAGNOSIS — N400 Enlarged prostate without lower urinary tract symptoms: Secondary | ICD-10-CM | POA: Diagnosis not present

## 2017-05-14 DIAGNOSIS — E46 Unspecified protein-calorie malnutrition: Secondary | ICD-10-CM | POA: Diagnosis not present

## 2017-05-14 DIAGNOSIS — F209 Schizophrenia, unspecified: Secondary | ICD-10-CM | POA: Diagnosis not present

## 2017-05-14 DIAGNOSIS — R1310 Dysphagia, unspecified: Secondary | ICD-10-CM | POA: Diagnosis not present

## 2017-05-14 DIAGNOSIS — I82409 Acute embolism and thrombosis of unspecified deep veins of unspecified lower extremity: Secondary | ICD-10-CM | POA: Diagnosis not present

## 2017-06-25 DIAGNOSIS — F329 Major depressive disorder, single episode, unspecified: Secondary | ICD-10-CM | POA: Diagnosis not present

## 2017-06-25 DIAGNOSIS — R569 Unspecified convulsions: Secondary | ICD-10-CM | POA: Diagnosis not present

## 2017-06-25 DIAGNOSIS — J449 Chronic obstructive pulmonary disease, unspecified: Secondary | ICD-10-CM | POA: Diagnosis not present

## 2017-06-25 DIAGNOSIS — I890 Lymphedema, not elsewhere classified: Secondary | ICD-10-CM | POA: Diagnosis not present

## 2017-06-25 DIAGNOSIS — F0391 Unspecified dementia with behavioral disturbance: Secondary | ICD-10-CM | POA: Diagnosis not present

## 2017-06-25 DIAGNOSIS — F319 Bipolar disorder, unspecified: Secondary | ICD-10-CM | POA: Diagnosis not present

## 2017-06-25 DIAGNOSIS — F209 Schizophrenia, unspecified: Secondary | ICD-10-CM | POA: Diagnosis not present

## 2017-06-25 DIAGNOSIS — E46 Unspecified protein-calorie malnutrition: Secondary | ICD-10-CM | POA: Diagnosis not present

## 2017-07-07 DIAGNOSIS — R531 Weakness: Secondary | ICD-10-CM | POA: Diagnosis not present

## 2017-07-16 DIAGNOSIS — F39 Unspecified mood [affective] disorder: Secondary | ICD-10-CM | POA: Diagnosis not present

## 2017-07-16 DIAGNOSIS — D696 Thrombocytopenia, unspecified: Secondary | ICD-10-CM | POA: Diagnosis not present

## 2017-07-16 DIAGNOSIS — R1310 Dysphagia, unspecified: Secondary | ICD-10-CM | POA: Diagnosis not present

## 2017-07-16 DIAGNOSIS — F259 Schizoaffective disorder, unspecified: Secondary | ICD-10-CM | POA: Diagnosis not present

## 2017-07-16 DIAGNOSIS — F29 Unspecified psychosis not due to a substance or known physiological condition: Secondary | ICD-10-CM | POA: Diagnosis not present

## 2017-07-16 DIAGNOSIS — F209 Schizophrenia, unspecified: Secondary | ICD-10-CM | POA: Diagnosis not present

## 2017-07-16 DIAGNOSIS — F411 Generalized anxiety disorder: Secondary | ICD-10-CM | POA: Diagnosis not present

## 2017-07-16 DIAGNOSIS — F0391 Unspecified dementia with behavioral disturbance: Secondary | ICD-10-CM | POA: Diagnosis not present

## 2017-07-16 DIAGNOSIS — E46 Unspecified protein-calorie malnutrition: Secondary | ICD-10-CM | POA: Diagnosis not present

## 2017-07-16 DIAGNOSIS — F319 Bipolar disorder, unspecified: Secondary | ICD-10-CM | POA: Diagnosis not present

## 2017-07-16 DIAGNOSIS — I890 Lymphedema, not elsewhere classified: Secondary | ICD-10-CM | POA: Diagnosis not present

## 2017-07-16 DIAGNOSIS — I10 Essential (primary) hypertension: Secondary | ICD-10-CM | POA: Diagnosis not present

## 2017-08-03 DIAGNOSIS — E039 Hypothyroidism, unspecified: Secondary | ICD-10-CM | POA: Diagnosis not present

## 2017-08-03 DIAGNOSIS — Z79899 Other long term (current) drug therapy: Secondary | ICD-10-CM | POA: Diagnosis not present

## 2017-08-03 DIAGNOSIS — N289 Disorder of kidney and ureter, unspecified: Secondary | ICD-10-CM | POA: Diagnosis not present

## 2017-08-03 DIAGNOSIS — D649 Anemia, unspecified: Secondary | ICD-10-CM | POA: Diagnosis not present

## 2017-08-03 DIAGNOSIS — R569 Unspecified convulsions: Secondary | ICD-10-CM | POA: Diagnosis not present

## 2017-08-11 DIAGNOSIS — I82409 Acute embolism and thrombosis of unspecified deep veins of unspecified lower extremity: Secondary | ICD-10-CM | POA: Diagnosis not present

## 2017-08-12 DIAGNOSIS — I82409 Acute embolism and thrombosis of unspecified deep veins of unspecified lower extremity: Secondary | ICD-10-CM | POA: Diagnosis not present

## 2017-08-13 DIAGNOSIS — I82402 Acute embolism and thrombosis of unspecified deep veins of left lower extremity: Secondary | ICD-10-CM | POA: Diagnosis not present

## 2017-08-13 DIAGNOSIS — F319 Bipolar disorder, unspecified: Secondary | ICD-10-CM | POA: Diagnosis not present

## 2017-08-13 DIAGNOSIS — R569 Unspecified convulsions: Secondary | ICD-10-CM | POA: Diagnosis not present

## 2017-08-13 DIAGNOSIS — I10 Essential (primary) hypertension: Secondary | ICD-10-CM | POA: Diagnosis not present

## 2017-08-13 DIAGNOSIS — N400 Enlarged prostate without lower urinary tract symptoms: Secondary | ICD-10-CM | POA: Diagnosis not present

## 2017-08-13 DIAGNOSIS — F39 Unspecified mood [affective] disorder: Secondary | ICD-10-CM | POA: Diagnosis not present

## 2017-08-13 DIAGNOSIS — F79 Unspecified intellectual disabilities: Secondary | ICD-10-CM | POA: Diagnosis not present

## 2017-08-13 DIAGNOSIS — I890 Lymphedema, not elsewhere classified: Secondary | ICD-10-CM | POA: Diagnosis not present

## 2017-08-13 DIAGNOSIS — F259 Schizoaffective disorder, unspecified: Secondary | ICD-10-CM | POA: Diagnosis not present

## 2017-08-13 DIAGNOSIS — D696 Thrombocytopenia, unspecified: Secondary | ICD-10-CM | POA: Diagnosis not present

## 2017-08-13 DIAGNOSIS — I82409 Acute embolism and thrombosis of unspecified deep veins of unspecified lower extremity: Secondary | ICD-10-CM | POA: Diagnosis not present

## 2017-08-13 DIAGNOSIS — F209 Schizophrenia, unspecified: Secondary | ICD-10-CM | POA: Diagnosis not present

## 2017-08-13 DIAGNOSIS — I509 Heart failure, unspecified: Secondary | ICD-10-CM | POA: Diagnosis not present

## 2017-08-13 DIAGNOSIS — F29 Unspecified psychosis not due to a substance or known physiological condition: Secondary | ICD-10-CM | POA: Diagnosis not present

## 2017-08-13 DIAGNOSIS — J449 Chronic obstructive pulmonary disease, unspecified: Secondary | ICD-10-CM | POA: Diagnosis not present

## 2017-08-13 DIAGNOSIS — F411 Generalized anxiety disorder: Secondary | ICD-10-CM | POA: Diagnosis not present

## 2017-08-13 DIAGNOSIS — F0391 Unspecified dementia with behavioral disturbance: Secondary | ICD-10-CM | POA: Diagnosis not present

## 2017-08-15 DIAGNOSIS — I82409 Acute embolism and thrombosis of unspecified deep veins of unspecified lower extremity: Secondary | ICD-10-CM | POA: Diagnosis not present

## 2017-08-16 DIAGNOSIS — I82409 Acute embolism and thrombosis of unspecified deep veins of unspecified lower extremity: Secondary | ICD-10-CM | POA: Diagnosis not present

## 2017-08-17 DIAGNOSIS — I82409 Acute embolism and thrombosis of unspecified deep veins of unspecified lower extremity: Secondary | ICD-10-CM | POA: Diagnosis not present

## 2017-08-18 DIAGNOSIS — I82409 Acute embolism and thrombosis of unspecified deep veins of unspecified lower extremity: Secondary | ICD-10-CM | POA: Diagnosis not present

## 2017-08-19 DIAGNOSIS — I82409 Acute embolism and thrombosis of unspecified deep veins of unspecified lower extremity: Secondary | ICD-10-CM | POA: Diagnosis not present

## 2017-08-20 DIAGNOSIS — I82409 Acute embolism and thrombosis of unspecified deep veins of unspecified lower extremity: Secondary | ICD-10-CM | POA: Diagnosis not present

## 2017-08-22 DIAGNOSIS — I82409 Acute embolism and thrombosis of unspecified deep veins of unspecified lower extremity: Secondary | ICD-10-CM | POA: Diagnosis not present

## 2017-08-24 DIAGNOSIS — I82409 Acute embolism and thrombosis of unspecified deep veins of unspecified lower extremity: Secondary | ICD-10-CM | POA: Diagnosis not present

## 2017-08-25 DIAGNOSIS — I82409 Acute embolism and thrombosis of unspecified deep veins of unspecified lower extremity: Secondary | ICD-10-CM | POA: Diagnosis not present

## 2017-08-26 DIAGNOSIS — I82409 Acute embolism and thrombosis of unspecified deep veins of unspecified lower extremity: Secondary | ICD-10-CM | POA: Diagnosis not present

## 2017-08-27 DIAGNOSIS — I82409 Acute embolism and thrombosis of unspecified deep veins of unspecified lower extremity: Secondary | ICD-10-CM | POA: Diagnosis not present

## 2017-08-28 DIAGNOSIS — Z23 Encounter for immunization: Secondary | ICD-10-CM | POA: Diagnosis not present

## 2017-08-28 DIAGNOSIS — I82409 Acute embolism and thrombosis of unspecified deep veins of unspecified lower extremity: Secondary | ICD-10-CM | POA: Diagnosis not present

## 2017-08-31 DIAGNOSIS — I82409 Acute embolism and thrombosis of unspecified deep veins of unspecified lower extremity: Secondary | ICD-10-CM | POA: Diagnosis not present

## 2017-09-01 DIAGNOSIS — I82409 Acute embolism and thrombosis of unspecified deep veins of unspecified lower extremity: Secondary | ICD-10-CM | POA: Diagnosis not present

## 2017-09-02 DIAGNOSIS — I82409 Acute embolism and thrombosis of unspecified deep veins of unspecified lower extremity: Secondary | ICD-10-CM | POA: Diagnosis not present

## 2017-09-03 DIAGNOSIS — I82409 Acute embolism and thrombosis of unspecified deep veins of unspecified lower extremity: Secondary | ICD-10-CM | POA: Diagnosis not present

## 2017-09-04 DIAGNOSIS — I82409 Acute embolism and thrombosis of unspecified deep veins of unspecified lower extremity: Secondary | ICD-10-CM | POA: Diagnosis not present

## 2017-09-07 DIAGNOSIS — I82409 Acute embolism and thrombosis of unspecified deep veins of unspecified lower extremity: Secondary | ICD-10-CM | POA: Diagnosis not present

## 2017-09-08 DIAGNOSIS — I82409 Acute embolism and thrombosis of unspecified deep veins of unspecified lower extremity: Secondary | ICD-10-CM | POA: Diagnosis not present

## 2017-09-09 DIAGNOSIS — I82409 Acute embolism and thrombosis of unspecified deep veins of unspecified lower extremity: Secondary | ICD-10-CM | POA: Diagnosis not present

## 2017-09-10 DIAGNOSIS — E46 Unspecified protein-calorie malnutrition: Secondary | ICD-10-CM | POA: Diagnosis not present

## 2017-09-10 DIAGNOSIS — D696 Thrombocytopenia, unspecified: Secondary | ICD-10-CM | POA: Diagnosis not present

## 2017-09-10 DIAGNOSIS — I82409 Acute embolism and thrombosis of unspecified deep veins of unspecified lower extremity: Secondary | ICD-10-CM | POA: Diagnosis not present

## 2017-09-10 DIAGNOSIS — R569 Unspecified convulsions: Secondary | ICD-10-CM | POA: Diagnosis not present

## 2017-09-10 DIAGNOSIS — F0391 Unspecified dementia with behavioral disturbance: Secondary | ICD-10-CM | POA: Diagnosis not present

## 2017-09-10 DIAGNOSIS — E039 Hypothyroidism, unspecified: Secondary | ICD-10-CM | POA: Diagnosis not present

## 2017-09-10 DIAGNOSIS — I890 Lymphedema, not elsewhere classified: Secondary | ICD-10-CM | POA: Diagnosis not present

## 2017-09-10 DIAGNOSIS — I82402 Acute embolism and thrombosis of unspecified deep veins of left lower extremity: Secondary | ICD-10-CM | POA: Diagnosis not present

## 2017-09-10 DIAGNOSIS — F319 Bipolar disorder, unspecified: Secondary | ICD-10-CM | POA: Diagnosis not present

## 2017-09-11 DIAGNOSIS — I82409 Acute embolism and thrombosis of unspecified deep veins of unspecified lower extremity: Secondary | ICD-10-CM | POA: Diagnosis not present

## 2017-09-14 DIAGNOSIS — I82409 Acute embolism and thrombosis of unspecified deep veins of unspecified lower extremity: Secondary | ICD-10-CM | POA: Diagnosis not present

## 2017-09-17 DIAGNOSIS — F29 Unspecified psychosis not due to a substance or known physiological condition: Secondary | ICD-10-CM | POA: Diagnosis not present

## 2017-09-17 DIAGNOSIS — F39 Unspecified mood [affective] disorder: Secondary | ICD-10-CM | POA: Diagnosis not present

## 2017-09-17 DIAGNOSIS — F259 Schizoaffective disorder, unspecified: Secondary | ICD-10-CM | POA: Diagnosis not present

## 2017-09-17 DIAGNOSIS — F411 Generalized anxiety disorder: Secondary | ICD-10-CM | POA: Diagnosis not present

## 2017-10-01 DIAGNOSIS — J309 Allergic rhinitis, unspecified: Secondary | ICD-10-CM | POA: Diagnosis not present

## 2017-10-12 DIAGNOSIS — R293 Abnormal posture: Secondary | ICD-10-CM | POA: Diagnosis not present

## 2017-10-12 DIAGNOSIS — G4089 Other seizures: Secondary | ICD-10-CM | POA: Diagnosis not present

## 2017-10-13 DIAGNOSIS — R293 Abnormal posture: Secondary | ICD-10-CM | POA: Diagnosis not present

## 2017-10-13 DIAGNOSIS — G4089 Other seizures: Secondary | ICD-10-CM | POA: Diagnosis not present

## 2017-10-14 DIAGNOSIS — R293 Abnormal posture: Secondary | ICD-10-CM | POA: Diagnosis not present

## 2017-10-14 DIAGNOSIS — G4089 Other seizures: Secondary | ICD-10-CM | POA: Diagnosis not present

## 2017-10-15 DIAGNOSIS — I82409 Acute embolism and thrombosis of unspecified deep veins of unspecified lower extremity: Secondary | ICD-10-CM | POA: Diagnosis not present

## 2017-10-15 DIAGNOSIS — R569 Unspecified convulsions: Secondary | ICD-10-CM | POA: Diagnosis not present

## 2017-10-15 DIAGNOSIS — F209 Schizophrenia, unspecified: Secondary | ICD-10-CM | POA: Diagnosis not present

## 2017-10-15 DIAGNOSIS — E039 Hypothyroidism, unspecified: Secondary | ICD-10-CM | POA: Diagnosis not present

## 2017-10-15 DIAGNOSIS — E46 Unspecified protein-calorie malnutrition: Secondary | ICD-10-CM | POA: Diagnosis not present

## 2017-10-15 DIAGNOSIS — R293 Abnormal posture: Secondary | ICD-10-CM | POA: Diagnosis not present

## 2017-10-15 DIAGNOSIS — G4089 Other seizures: Secondary | ICD-10-CM | POA: Diagnosis not present

## 2017-10-15 DIAGNOSIS — J449 Chronic obstructive pulmonary disease, unspecified: Secondary | ICD-10-CM | POA: Diagnosis not present

## 2017-10-15 DIAGNOSIS — D696 Thrombocytopenia, unspecified: Secondary | ICD-10-CM | POA: Diagnosis not present

## 2017-10-15 DIAGNOSIS — H903 Sensorineural hearing loss, bilateral: Secondary | ICD-10-CM | POA: Diagnosis not present

## 2017-10-15 DIAGNOSIS — I890 Lymphedema, not elsewhere classified: Secondary | ICD-10-CM | POA: Diagnosis not present

## 2017-10-15 DIAGNOSIS — F0391 Unspecified dementia with behavioral disturbance: Secondary | ICD-10-CM | POA: Diagnosis not present

## 2017-10-15 DIAGNOSIS — I10 Essential (primary) hypertension: Secondary | ICD-10-CM | POA: Diagnosis not present

## 2017-10-16 DIAGNOSIS — R293 Abnormal posture: Secondary | ICD-10-CM | POA: Diagnosis not present

## 2017-10-16 DIAGNOSIS — G4089 Other seizures: Secondary | ICD-10-CM | POA: Diagnosis not present

## 2017-10-20 DIAGNOSIS — R293 Abnormal posture: Secondary | ICD-10-CM | POA: Diagnosis not present

## 2017-10-20 DIAGNOSIS — G4089 Other seizures: Secondary | ICD-10-CM | POA: Diagnosis not present

## 2017-10-23 DIAGNOSIS — G4089 Other seizures: Secondary | ICD-10-CM | POA: Diagnosis not present

## 2017-10-23 DIAGNOSIS — R293 Abnormal posture: Secondary | ICD-10-CM | POA: Diagnosis not present

## 2017-10-24 DIAGNOSIS — G4089 Other seizures: Secondary | ICD-10-CM | POA: Diagnosis not present

## 2017-10-24 DIAGNOSIS — R293 Abnormal posture: Secondary | ICD-10-CM | POA: Diagnosis not present

## 2017-10-26 DIAGNOSIS — R293 Abnormal posture: Secondary | ICD-10-CM | POA: Diagnosis not present

## 2017-10-26 DIAGNOSIS — G4089 Other seizures: Secondary | ICD-10-CM | POA: Diagnosis not present

## 2017-10-27 DIAGNOSIS — R293 Abnormal posture: Secondary | ICD-10-CM | POA: Diagnosis not present

## 2017-10-27 DIAGNOSIS — G4089 Other seizures: Secondary | ICD-10-CM | POA: Diagnosis not present

## 2017-10-31 DIAGNOSIS — G4089 Other seizures: Secondary | ICD-10-CM | POA: Diagnosis not present

## 2017-10-31 DIAGNOSIS — R293 Abnormal posture: Secondary | ICD-10-CM | POA: Diagnosis not present

## 2017-11-03 DIAGNOSIS — R293 Abnormal posture: Secondary | ICD-10-CM | POA: Diagnosis not present

## 2017-11-03 DIAGNOSIS — G4089 Other seizures: Secondary | ICD-10-CM | POA: Diagnosis not present

## 2017-11-04 DIAGNOSIS — R293 Abnormal posture: Secondary | ICD-10-CM | POA: Diagnosis not present

## 2017-11-04 DIAGNOSIS — G4089 Other seizures: Secondary | ICD-10-CM | POA: Diagnosis not present

## 2017-11-05 DIAGNOSIS — G4089 Other seizures: Secondary | ICD-10-CM | POA: Diagnosis not present

## 2017-11-05 DIAGNOSIS — R293 Abnormal posture: Secondary | ICD-10-CM | POA: Diagnosis not present

## 2017-11-06 DIAGNOSIS — G4089 Other seizures: Secondary | ICD-10-CM | POA: Diagnosis not present

## 2017-11-06 DIAGNOSIS — R293 Abnormal posture: Secondary | ICD-10-CM | POA: Diagnosis not present

## 2017-11-07 DIAGNOSIS — R293 Abnormal posture: Secondary | ICD-10-CM | POA: Diagnosis not present

## 2017-11-07 DIAGNOSIS — G4089 Other seizures: Secondary | ICD-10-CM | POA: Diagnosis not present

## 2017-11-12 DIAGNOSIS — I890 Lymphedema, not elsewhere classified: Secondary | ICD-10-CM | POA: Diagnosis not present

## 2017-11-12 DIAGNOSIS — E039 Hypothyroidism, unspecified: Secondary | ICD-10-CM | POA: Diagnosis not present

## 2017-11-12 DIAGNOSIS — I82409 Acute embolism and thrombosis of unspecified deep veins of unspecified lower extremity: Secondary | ICD-10-CM | POA: Diagnosis not present

## 2017-11-12 DIAGNOSIS — F250 Schizoaffective disorder, bipolar type: Secondary | ICD-10-CM | POA: Diagnosis not present

## 2017-11-12 DIAGNOSIS — I10 Essential (primary) hypertension: Secondary | ICD-10-CM | POA: Diagnosis not present

## 2017-11-12 DIAGNOSIS — F0391 Unspecified dementia with behavioral disturbance: Secondary | ICD-10-CM | POA: Diagnosis not present

## 2017-11-12 DIAGNOSIS — J449 Chronic obstructive pulmonary disease, unspecified: Secondary | ICD-10-CM | POA: Diagnosis not present

## 2017-11-12 DIAGNOSIS — R1310 Dysphagia, unspecified: Secondary | ICD-10-CM | POA: Diagnosis not present

## 2017-11-27 DIAGNOSIS — I890 Lymphedema, not elsewhere classified: Secondary | ICD-10-CM | POA: Diagnosis not present

## 2017-12-03 DIAGNOSIS — F411 Generalized anxiety disorder: Secondary | ICD-10-CM | POA: Diagnosis not present

## 2017-12-03 DIAGNOSIS — G259 Extrapyramidal and movement disorder, unspecified: Secondary | ICD-10-CM | POA: Diagnosis not present

## 2017-12-03 DIAGNOSIS — F3164 Bipolar disorder, current episode mixed, severe, with psychotic features: Secondary | ICD-10-CM | POA: Diagnosis not present

## 2017-12-03 DIAGNOSIS — F259 Schizoaffective disorder, unspecified: Secondary | ICD-10-CM | POA: Diagnosis not present

## 2017-12-08 DIAGNOSIS — E039 Hypothyroidism, unspecified: Secondary | ICD-10-CM | POA: Diagnosis not present

## 2017-12-08 DIAGNOSIS — D509 Iron deficiency anemia, unspecified: Secondary | ICD-10-CM | POA: Diagnosis not present

## 2017-12-17 DIAGNOSIS — I890 Lymphedema, not elsewhere classified: Secondary | ICD-10-CM | POA: Diagnosis not present

## 2017-12-17 DIAGNOSIS — F0391 Unspecified dementia with behavioral disturbance: Secondary | ICD-10-CM | POA: Diagnosis not present

## 2017-12-17 DIAGNOSIS — E46 Unspecified protein-calorie malnutrition: Secondary | ICD-10-CM | POA: Diagnosis not present

## 2017-12-17 DIAGNOSIS — F250 Schizoaffective disorder, bipolar type: Secondary | ICD-10-CM | POA: Diagnosis not present

## 2017-12-17 DIAGNOSIS — E039 Hypothyroidism, unspecified: Secondary | ICD-10-CM | POA: Diagnosis not present

## 2017-12-17 DIAGNOSIS — R569 Unspecified convulsions: Secondary | ICD-10-CM | POA: Diagnosis not present

## 2017-12-17 DIAGNOSIS — D696 Thrombocytopenia, unspecified: Secondary | ICD-10-CM | POA: Diagnosis not present

## 2017-12-17 DIAGNOSIS — I10 Essential (primary) hypertension: Secondary | ICD-10-CM | POA: Diagnosis not present

## 2017-12-17 DIAGNOSIS — I82402 Acute embolism and thrombosis of unspecified deep veins of left lower extremity: Secondary | ICD-10-CM | POA: Diagnosis not present

## 2018-01-05 DIAGNOSIS — R531 Weakness: Secondary | ICD-10-CM | POA: Diagnosis not present

## 2018-01-11 DIAGNOSIS — I890 Lymphedema, not elsewhere classified: Secondary | ICD-10-CM | POA: Diagnosis not present

## 2018-01-12 DIAGNOSIS — I890 Lymphedema, not elsewhere classified: Secondary | ICD-10-CM | POA: Diagnosis not present

## 2018-01-13 DIAGNOSIS — I890 Lymphedema, not elsewhere classified: Secondary | ICD-10-CM | POA: Diagnosis not present

## 2018-01-14 DIAGNOSIS — I890 Lymphedema, not elsewhere classified: Secondary | ICD-10-CM | POA: Diagnosis not present

## 2018-01-16 DIAGNOSIS — I890 Lymphedema, not elsewhere classified: Secondary | ICD-10-CM | POA: Diagnosis not present

## 2018-01-18 DIAGNOSIS — I890 Lymphedema, not elsewhere classified: Secondary | ICD-10-CM | POA: Diagnosis not present

## 2018-01-20 DIAGNOSIS — I890 Lymphedema, not elsewhere classified: Secondary | ICD-10-CM | POA: Diagnosis not present

## 2018-01-21 DIAGNOSIS — E46 Unspecified protein-calorie malnutrition: Secondary | ICD-10-CM | POA: Diagnosis not present

## 2018-01-21 DIAGNOSIS — R569 Unspecified convulsions: Secondary | ICD-10-CM | POA: Diagnosis not present

## 2018-01-21 DIAGNOSIS — I890 Lymphedema, not elsewhere classified: Secondary | ICD-10-CM | POA: Diagnosis not present

## 2018-01-21 DIAGNOSIS — F209 Schizophrenia, unspecified: Secondary | ICD-10-CM | POA: Diagnosis not present

## 2018-01-21 DIAGNOSIS — I10 Essential (primary) hypertension: Secondary | ICD-10-CM | POA: Diagnosis not present

## 2018-01-21 DIAGNOSIS — F0391 Unspecified dementia with behavioral disturbance: Secondary | ICD-10-CM | POA: Diagnosis not present

## 2018-01-22 DIAGNOSIS — I890 Lymphedema, not elsewhere classified: Secondary | ICD-10-CM | POA: Diagnosis not present

## 2018-01-25 DIAGNOSIS — I890 Lymphedema, not elsewhere classified: Secondary | ICD-10-CM | POA: Diagnosis not present

## 2018-01-26 DIAGNOSIS — I890 Lymphedema, not elsewhere classified: Secondary | ICD-10-CM | POA: Diagnosis not present

## 2018-01-27 DIAGNOSIS — M6281 Muscle weakness (generalized): Secondary | ICD-10-CM | POA: Diagnosis not present

## 2018-01-28 DIAGNOSIS — F209 Schizophrenia, unspecified: Secondary | ICD-10-CM | POA: Diagnosis not present

## 2018-01-28 DIAGNOSIS — M6281 Muscle weakness (generalized): Secondary | ICD-10-CM | POA: Diagnosis not present

## 2018-01-28 DIAGNOSIS — G259 Extrapyramidal and movement disorder, unspecified: Secondary | ICD-10-CM | POA: Diagnosis not present

## 2018-01-28 DIAGNOSIS — F411 Generalized anxiety disorder: Secondary | ICD-10-CM | POA: Diagnosis not present

## 2018-01-28 DIAGNOSIS — F329 Major depressive disorder, single episode, unspecified: Secondary | ICD-10-CM | POA: Diagnosis not present

## 2018-01-29 DIAGNOSIS — M6281 Muscle weakness (generalized): Secondary | ICD-10-CM | POA: Diagnosis not present

## 2018-02-01 DIAGNOSIS — M6281 Muscle weakness (generalized): Secondary | ICD-10-CM | POA: Diagnosis not present

## 2018-02-02 DIAGNOSIS — M6281 Muscle weakness (generalized): Secondary | ICD-10-CM | POA: Diagnosis not present

## 2018-02-03 DIAGNOSIS — M6281 Muscle weakness (generalized): Secondary | ICD-10-CM | POA: Diagnosis not present

## 2018-02-05 DIAGNOSIS — M6281 Muscle weakness (generalized): Secondary | ICD-10-CM | POA: Diagnosis not present

## 2018-02-11 DIAGNOSIS — F0390 Unspecified dementia without behavioral disturbance: Secondary | ICD-10-CM | POA: Diagnosis not present

## 2018-02-11 DIAGNOSIS — I890 Lymphedema, not elsewhere classified: Secondary | ICD-10-CM | POA: Diagnosis not present

## 2018-02-11 DIAGNOSIS — J449 Chronic obstructive pulmonary disease, unspecified: Secondary | ICD-10-CM | POA: Diagnosis not present

## 2018-02-11 DIAGNOSIS — R569 Unspecified convulsions: Secondary | ICD-10-CM | POA: Diagnosis not present

## 2018-02-11 DIAGNOSIS — F319 Bipolar disorder, unspecified: Secondary | ICD-10-CM | POA: Diagnosis not present

## 2018-02-11 DIAGNOSIS — F209 Schizophrenia, unspecified: Secondary | ICD-10-CM | POA: Diagnosis not present

## 2018-02-11 DIAGNOSIS — D696 Thrombocytopenia, unspecified: Secondary | ICD-10-CM | POA: Diagnosis not present

## 2018-02-11 DIAGNOSIS — I82409 Acute embolism and thrombosis of unspecified deep veins of unspecified lower extremity: Secondary | ICD-10-CM | POA: Diagnosis not present

## 2018-02-11 DIAGNOSIS — E46 Unspecified protein-calorie malnutrition: Secondary | ICD-10-CM | POA: Diagnosis not present

## 2018-03-03 DIAGNOSIS — F3164 Bipolar disorder, current episode mixed, severe, with psychotic features: Secondary | ICD-10-CM | POA: Diagnosis not present

## 2018-03-03 DIAGNOSIS — F209 Schizophrenia, unspecified: Secondary | ICD-10-CM | POA: Diagnosis not present

## 2018-03-03 DIAGNOSIS — F411 Generalized anxiety disorder: Secondary | ICD-10-CM | POA: Diagnosis not present

## 2018-03-03 DIAGNOSIS — F329 Major depressive disorder, single episode, unspecified: Secondary | ICD-10-CM | POA: Diagnosis not present

## 2018-03-11 ENCOUNTER — Non-Acute Institutional Stay: Payer: Self-pay | Admitting: Hospice and Palliative Medicine

## 2018-03-11 DIAGNOSIS — Z515 Encounter for palliative care: Secondary | ICD-10-CM | POA: Diagnosis not present

## 2018-03-11 NOTE — Progress Notes (Signed)
PALLIATIVE CARE CONSULT VISIT   PATIENT NAME: Kristopher Pitts DOB: 12/04/1945 MRN: [id number]  PRIMARY CARE PROVIDER:   Angela Cox, MD  REFERRING PROVIDER:  Dr. Nehemiah Settle  RESPONSIBLE PARTY:   Brother  ASSESSMENT:      Patient appears clinically unchanged from when he was seen last two months ago. Staff deny changes and have no concerns today. Patient is confused and unable to engage meaningfully in conversation. No family present.   Oral intake adequate. No distressing symptoms reported.  RECOMMENDATIONS and PLAN:  1. Continue monitoring. Would recommend hospice in the setting of decline.   I spent 10 minutes providing this consultation,  from 1000 to 1010. More than 50% of the time in this consultation was spent coordinating communication.   HISTORY OF PRESENT ILLNESS:  Mr. Kristopher Pitts is a 72 yo man with multiple medical problems including COPD, h/o DVT, cognitive impairment, bipolar, schizoaffective d/o, HTN, epilepsy, hypothyroidism, and dysphagia. He has been followed by palliative care for FTT and decline. Routine follow up visit today reveals no significant clinical change since last seen.   CODE STATUS: DNR  PPS: 30-40% HOSPICE ELIGIBILITY/DIAGNOSIS: TBD  PAST MEDICAL HISTORY:  Past Medical History:  Diagnosis Date  . Asthma   . Bipolar disorder, unspecified   . Chronic airway obstruction, not elsewhere classified   . Deep vein thrombophlebitis of leg, unspecified laterality   . Hereditary edema of legs   . Muscle weakness (generalized)   . Nonspecific abnormal toxicological findings   . Schizoaffective disorder, unspecified condition   . Unspecified epilepsy without mention of intractable epilepsy   . Unspecified essential hypertension   . Unspecified hypothyroidism     SOCIAL HX:  Social History   Tobacco Use  . Smoking status: Never Smoker  . Smokeless tobacco: Never Used  Substance Use Topics  . Alcohol use: No    ALLERGIES:  Allergies    Allergen Reactions  . Coumadin [Warfarin Sodium]   . Lithium      PERTINENT MEDICATIONS:  Outpatient Encounter Medications as of 03/11/2018  Medication Sig  . AMBULATORY NON FORMULARY MEDICATION Medication Name: Lorazepam 1 mg /ML gel, apply 1 ML topically every 8 hours as needed  . benztropine (COGENTIN) 0.5 MG tablet Take 0.5 mg by mouth daily.  . clonazePAM (KLONOPIN) 0.5 MG tablet Take 1 tablet (0.5 mg total) by mouth 3 (three) times daily as needed for anxiety.  . divalproex (DEPAKOTE) 250 MG DR tablet Take 750 mg by mouth 2 (two) times daily. Take 3 tablets by mouth  daily  to equal 750 mg.  . finasteride (PROSCAR) 5 MG tablet Take 5 mg by mouth daily.  . fluPHENAZine (PROLIXIN) 10 MG tablet Take 10 mg by mouth 2 (two) times daily.  . furosemide (LASIX) 20 MG tablet Take 20 mg by mouth 3 (three) times daily.  . Iloperidone (FANAPT) 12 MG TABS Take 12 mg by mouth 2 (two) times daily.  Marland Kitchen ipratropium-albuterol (DUONEB) 0.5-2.5 (3) MG/3ML SOLN Take 3 mLs by nebulization as needed. For wheezing.  Marland Kitchen levothyroxine (SYNTHROID, LEVOTHROID) 75 MCG tablet Take 37.5 mcg by mouth daily.  Marland Kitchen olanzapine zydis (ZYPREXA) 15 MG disintegrating tablet Take 15 mg by mouth at bedtime.  Marland Kitchen oxybutynin (DITROPAN) 5 MG tablet Take 5 mg by mouth daily.  . phenytoin (DILANTIN) 100 MG ER capsule Take 100 mg by mouth 2 (two) times daily.  . potassium chloride (K-DUR,KLOR-CON) 10 MEQ tablet Take 10 mEq by mouth daily.  . Tamsulosin HCl (  FLOMAX) 0.4 MG CAPS Take 0.4 mg by mouth daily.  Marland Kitchen. topiramate (TOPAMAX) 100 MG tablet Take 100 mg by mouth daily.   No facility-administered encounter medications on file as of 03/11/2018.     PHYSICAL EXAM:   GEN: lying in bed, NAD RESPIRATORY: unlabored, CTA ABD: soft, nttp SKIN: intact NEURO: alert, answers simple questions, confused  Malachy MoanJOSHUA R BORDERS, NP

## 2018-03-18 DIAGNOSIS — F209 Schizophrenia, unspecified: Secondary | ICD-10-CM | POA: Diagnosis not present

## 2018-03-18 DIAGNOSIS — F0391 Unspecified dementia with behavioral disturbance: Secondary | ICD-10-CM | POA: Diagnosis not present

## 2018-03-18 DIAGNOSIS — D696 Thrombocytopenia, unspecified: Secondary | ICD-10-CM | POA: Diagnosis not present

## 2018-03-18 DIAGNOSIS — E46 Unspecified protein-calorie malnutrition: Secondary | ICD-10-CM | POA: Diagnosis not present

## 2018-03-18 DIAGNOSIS — I82402 Acute embolism and thrombosis of unspecified deep veins of left lower extremity: Secondary | ICD-10-CM | POA: Diagnosis not present

## 2018-03-18 DIAGNOSIS — R569 Unspecified convulsions: Secondary | ICD-10-CM | POA: Diagnosis not present

## 2018-03-18 DIAGNOSIS — E039 Hypothyroidism, unspecified: Secondary | ICD-10-CM | POA: Diagnosis not present

## 2018-04-08 DIAGNOSIS — F3164 Bipolar disorder, current episode mixed, severe, with psychotic features: Secondary | ICD-10-CM | POA: Diagnosis not present

## 2018-04-08 DIAGNOSIS — F209 Schizophrenia, unspecified: Secondary | ICD-10-CM | POA: Diagnosis not present

## 2018-04-08 DIAGNOSIS — G259 Extrapyramidal and movement disorder, unspecified: Secondary | ICD-10-CM | POA: Diagnosis not present

## 2018-04-08 DIAGNOSIS — F411 Generalized anxiety disorder: Secondary | ICD-10-CM | POA: Diagnosis not present

## 2018-04-13 ENCOUNTER — Non-Acute Institutional Stay: Payer: Medicare Other | Admitting: Hospice and Palliative Medicine

## 2018-04-13 DIAGNOSIS — Z515 Encounter for palliative care: Secondary | ICD-10-CM | POA: Diagnosis not present

## 2018-04-13 DIAGNOSIS — F0390 Unspecified dementia without behavioral disturbance: Secondary | ICD-10-CM | POA: Diagnosis not present

## 2018-04-13 NOTE — Progress Notes (Signed)
PALLIATIVE CARE CONSULT VISIT   PATIENT NAME: Kristopher Pitts DOB: 03/22/1946 MRN: [id number]  PRIMARY CARE PROVIDER:   Angela Coxasanayaka, Gayani Y, MD  REFERRING PROVIDER:  Dr. Nehemiah SettlePolite  RESPONSIBLE PARTY:   Brother  ASSESSMENT:     Case discussed with nursing staff who have no new concerns. Patient seems to be clinically stable. No significant change since last seen. Patient is comfortable appearing and has no distressing symptoms.   MOST form in chart.   RECOMMENDATIONS and PLAN:  1. Continue monitoring. Would recommend hospice in the setting of decline.    I spent 10 minutes providing this consultation,  from 0820 to 0830. More than 50% of the time in this consultation was spent coordinating communication.   HISTORY OF PRESENT ILLNESS:  Kristopher Pitts is a 72 yo man with multiple medical problems including COPD, h/o DVT, cognitive impairment, bipolar, schizoaffective d/o, HTN, epilepsy, hypothyroidism, and dysphagia. He has been followed by palliative care for FTT and decline. Routine follow up visit today reveals no significant clinical change since last seen.   CODE STATUS: DNR  PPS: 30-40% HOSPICE ELIGIBILITY/DIAGNOSIS: TBD  PAST MEDICAL HISTORY:  Past Medical History:  Diagnosis Date  . Asthma   . Bipolar disorder, unspecified   . Chronic airway obstruction, not elsewhere classified   . Deep vein thrombophlebitis of leg, unspecified laterality   . Hereditary edema of legs   . Muscle weakness (generalized)   . Nonspecific abnormal toxicological findings   . Schizoaffective disorder, unspecified condition   . Unspecified epilepsy without mention of intractable epilepsy   . Unspecified essential hypertension   . Unspecified hypothyroidism     SOCIAL HX:  Social History   Tobacco Use  . Smoking status: Never Smoker  . Smokeless tobacco: Never Used  Substance Use Topics  . Alcohol use: No    ALLERGIES:  Allergies  Allergen Reactions  . Coumadin [Warfarin Sodium]     . Lithium      PERTINENT MEDICATIONS:  Outpatient Encounter Medications as of 04/13/2018  Medication Sig  . AMBULATORY NON FORMULARY MEDICATION Medication Name: Lorazepam 1 mg /ML gel, apply 1 ML topically every 8 hours as needed  . benztropine (COGENTIN) 0.5 MG tablet Take 0.5 mg by mouth daily.  . clonazePAM (KLONOPIN) 0.5 MG tablet Take 1 tablet (0.5 mg total) by mouth 3 (three) times daily as needed for anxiety.  . divalproex (DEPAKOTE) 250 MG DR tablet Take 750 mg by mouth 2 (two) times daily. Take 3 tablets by mouth  daily  to equal 750 mg.  . finasteride (PROSCAR) 5 MG tablet Take 5 mg by mouth daily.  . fluPHENAZine (PROLIXIN) 10 MG tablet Take 10 mg by mouth 2 (two) times daily.  . furosemide (LASIX) 20 MG tablet Take 20 mg by mouth 3 (three) times daily.  . Iloperidone (FANAPT) 12 MG TABS Take 12 mg by mouth 2 (two) times daily.  Marland Kitchen. ipratropium-albuterol (DUONEB) 0.5-2.5 (3) MG/3ML SOLN Take 3 mLs by nebulization as needed. For wheezing.  Marland Kitchen. levothyroxine (SYNTHROID, LEVOTHROID) 75 MCG tablet Take 37.5 mcg by mouth daily.  Marland Kitchen. olanzapine zydis (ZYPREXA) 15 MG disintegrating tablet Take 15 mg by mouth at bedtime.  Marland Kitchen. oxybutynin (DITROPAN) 5 MG tablet Take 5 mg by mouth daily.  . phenytoin (DILANTIN) 100 MG ER capsule Take 100 mg by mouth 2 (two) times daily.  . potassium chloride (K-DUR,KLOR-CON) 10 MEQ tablet Take 10 mEq by mouth daily.  . Tamsulosin HCl (FLOMAX) 0.4 MG CAPS Take  0.4 mg by mouth daily.  Marland Kitchen topiramate (TOPAMAX) 100 MG tablet Take 100 mg by mouth daily.   No facility-administered encounter medications on file as of 04/13/2018.     PHYSICAL EXAM:   GEN: lying in bed, NAD RESPIRATORY: unlabored ABD: soft, nttp SKIN: exposed skin is intact, no edema NEURO: alert, answers simple questions, confused  Malachy Moan, NP

## 2018-04-22 DIAGNOSIS — F0391 Unspecified dementia with behavioral disturbance: Secondary | ICD-10-CM | POA: Diagnosis not present

## 2018-04-22 DIAGNOSIS — D509 Iron deficiency anemia, unspecified: Secondary | ICD-10-CM | POA: Diagnosis not present

## 2018-04-22 DIAGNOSIS — R569 Unspecified convulsions: Secondary | ICD-10-CM | POA: Diagnosis not present

## 2018-04-22 DIAGNOSIS — J449 Chronic obstructive pulmonary disease, unspecified: Secondary | ICD-10-CM | POA: Diagnosis not present

## 2018-04-22 DIAGNOSIS — I10 Essential (primary) hypertension: Secondary | ICD-10-CM | POA: Diagnosis not present

## 2018-04-22 DIAGNOSIS — F209 Schizophrenia, unspecified: Secondary | ICD-10-CM | POA: Diagnosis not present

## 2018-04-22 DIAGNOSIS — D696 Thrombocytopenia, unspecified: Secondary | ICD-10-CM | POA: Diagnosis not present

## 2018-05-06 IMAGING — CR DG FOOT COMPLETE 3+V*L*
4 series · 4 of 4 positions shown · non-contrast
Comparison: None.

CLINICAL DATA: Lymphedema.  No injury

EXAM:
LEFT FOOT - COMPLETE 3+ VIEW

[x foot ap left]
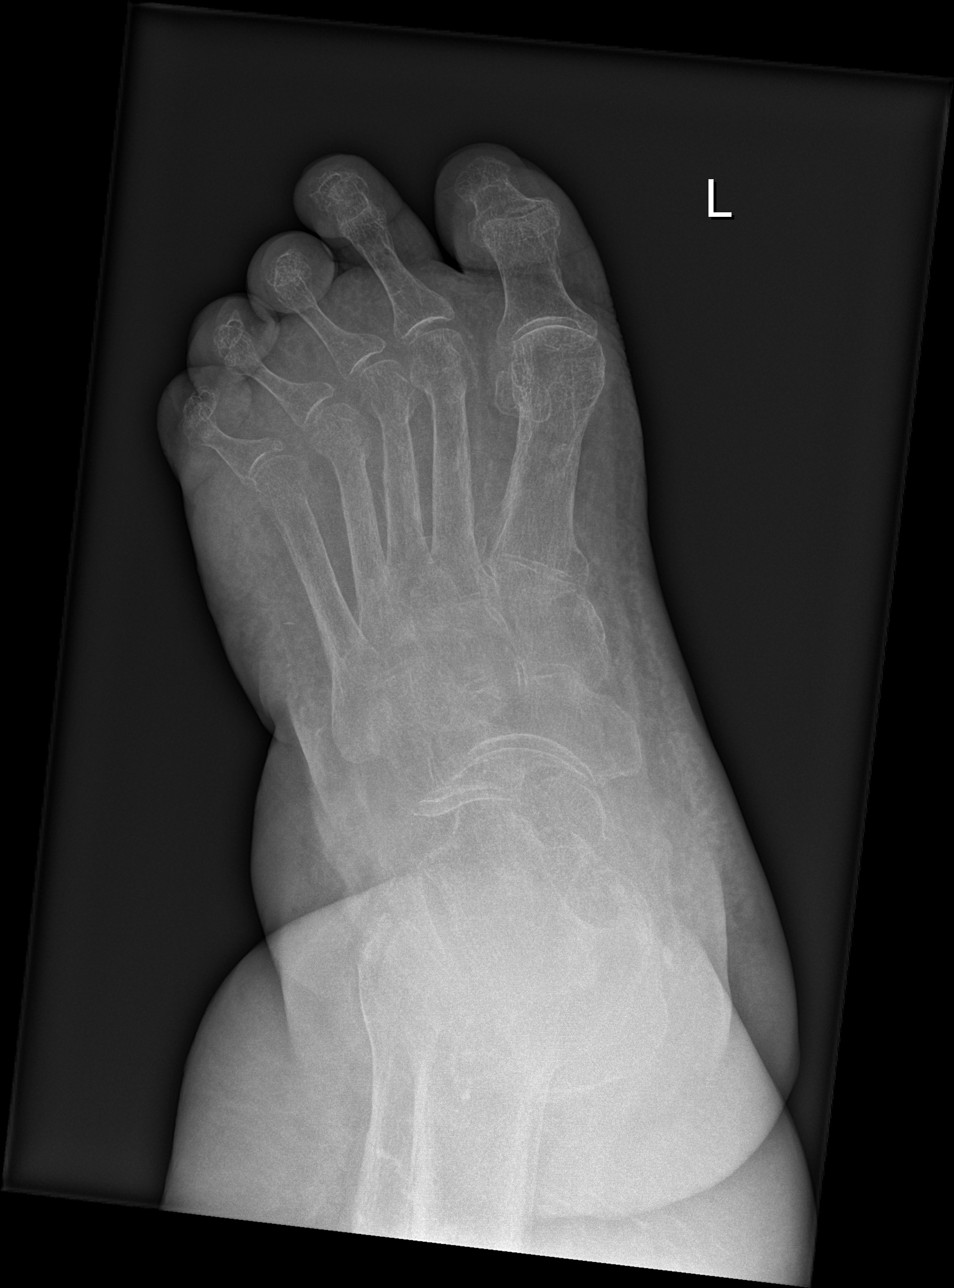

[x foot obl left]
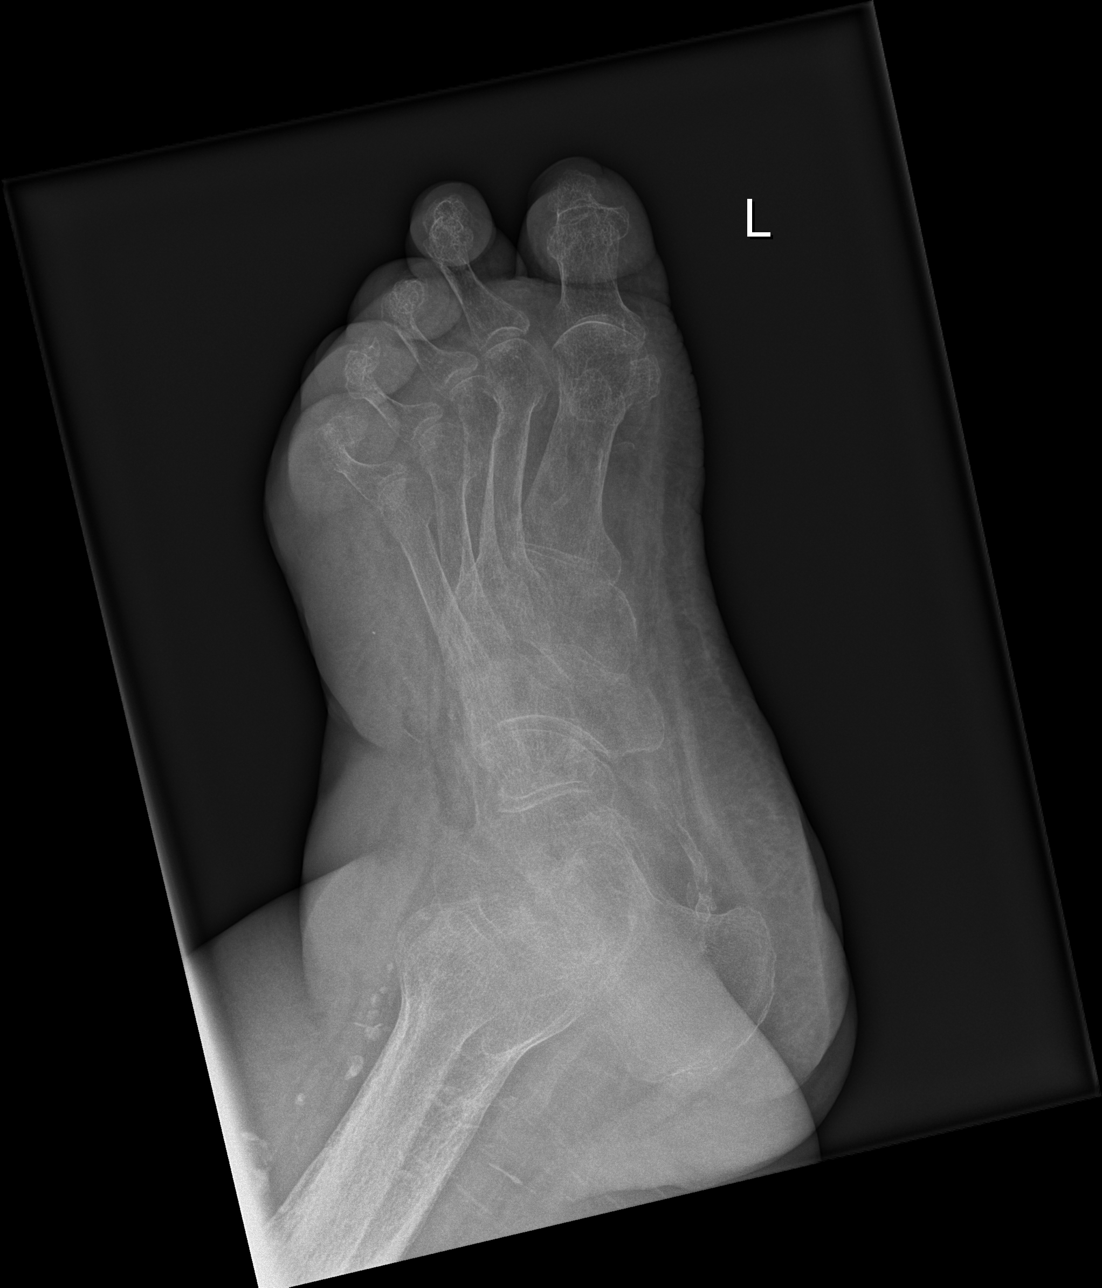

[x foot lat left (1 of 2)]
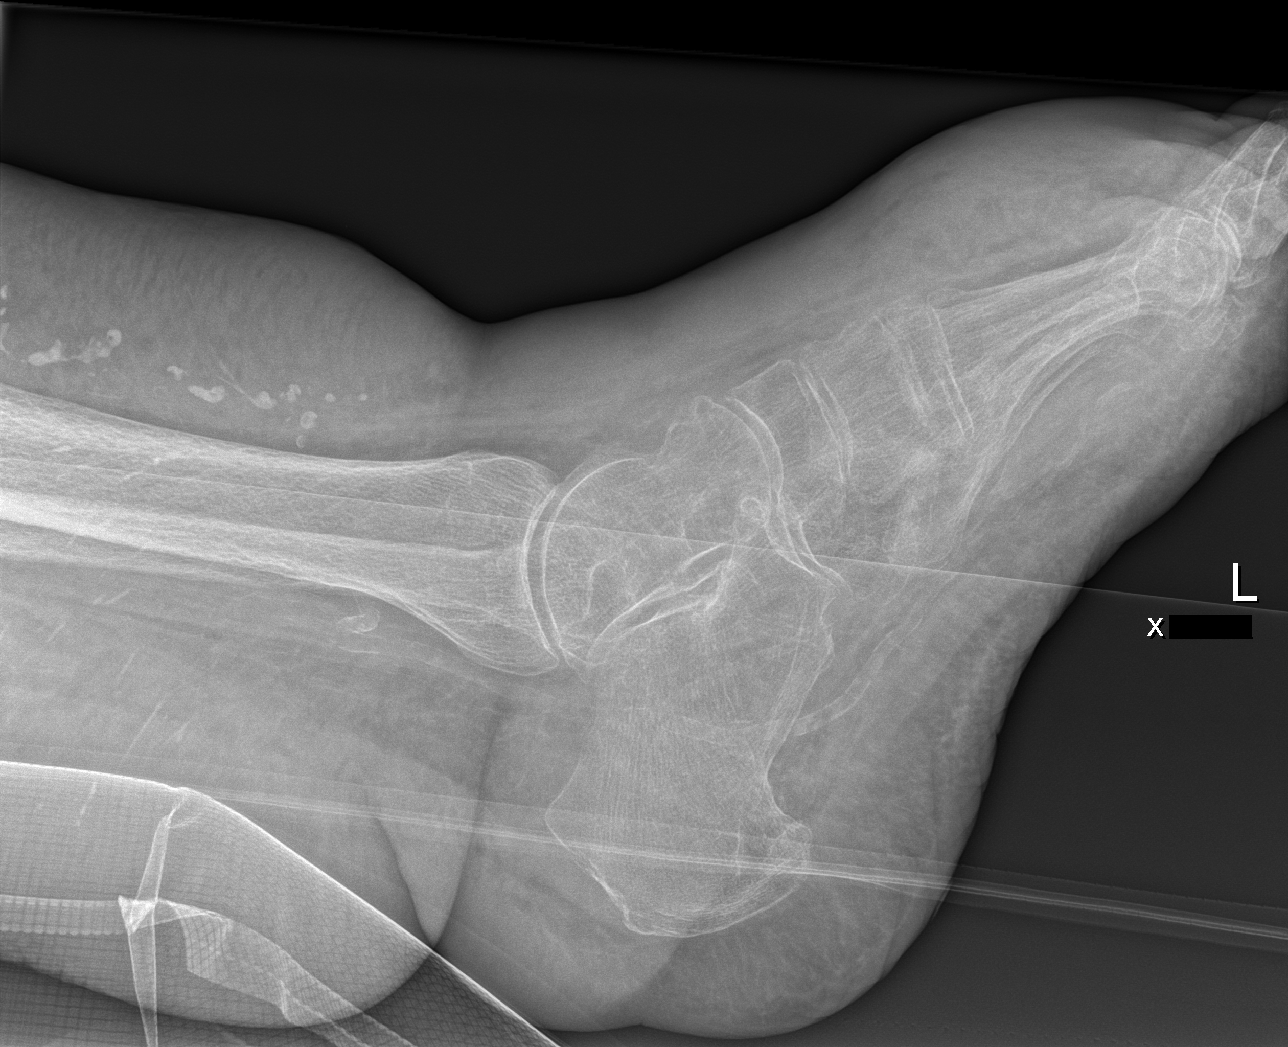

[x foot lat left (2 of 2)]
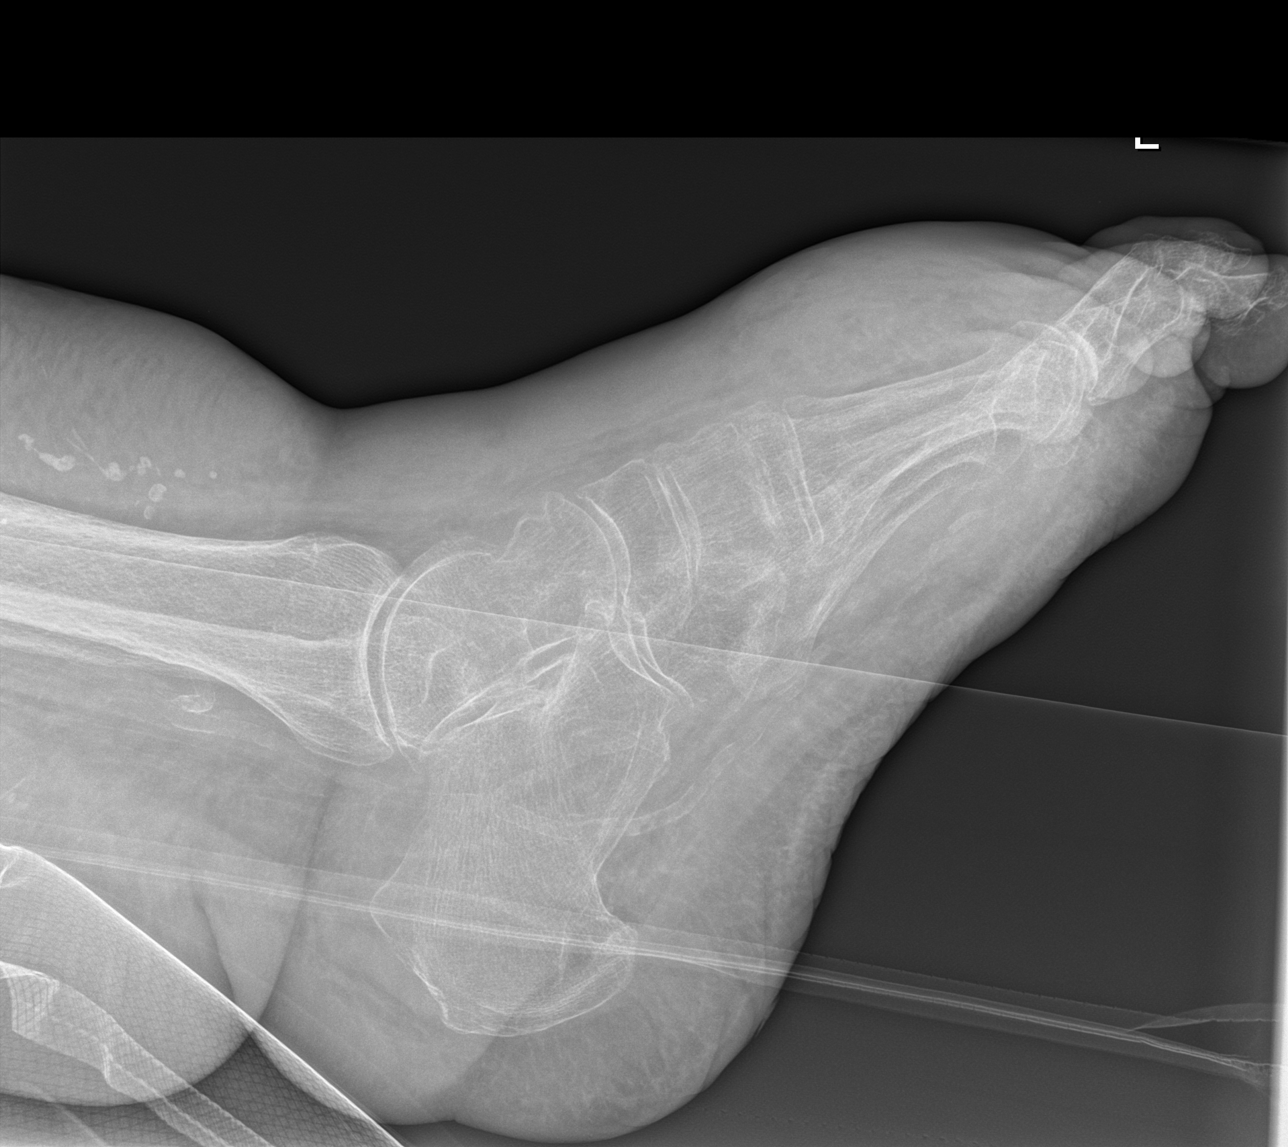

[4 of 4 positions shown; findings below may reference images not displayed]

FINDINGS: Generalized osteopenia. Negative for fracture or mass. No evidence
of osteomyelitis Extensive soft tissue swelling diffusely. Arterial
and venous calcification
IMPRESSION: No acute skeletal abnormality.  Severe soft tissue swelling.

## 2018-05-12 DIAGNOSIS — F258 Other schizoaffective disorders: Secondary | ICD-10-CM | POA: Diagnosis not present

## 2018-05-12 DIAGNOSIS — G259 Extrapyramidal and movement disorder, unspecified: Secondary | ICD-10-CM | POA: Diagnosis not present

## 2018-05-12 DIAGNOSIS — F3164 Bipolar disorder, current episode mixed, severe, with psychotic features: Secondary | ICD-10-CM | POA: Diagnosis not present

## 2018-05-12 DIAGNOSIS — F411 Generalized anxiety disorder: Secondary | ICD-10-CM | POA: Diagnosis not present

## 2018-05-19 DIAGNOSIS — I890 Lymphedema, not elsewhere classified: Secondary | ICD-10-CM | POA: Diagnosis not present

## 2018-05-20 DIAGNOSIS — E039 Hypothyroidism, unspecified: Secondary | ICD-10-CM | POA: Diagnosis not present

## 2018-05-20 DIAGNOSIS — J449 Chronic obstructive pulmonary disease, unspecified: Secondary | ICD-10-CM | POA: Diagnosis not present

## 2018-05-20 DIAGNOSIS — F209 Schizophrenia, unspecified: Secondary | ICD-10-CM | POA: Diagnosis not present

## 2018-05-20 DIAGNOSIS — N400 Enlarged prostate without lower urinary tract symptoms: Secondary | ICD-10-CM | POA: Diagnosis not present

## 2018-05-20 DIAGNOSIS — E46 Unspecified protein-calorie malnutrition: Secondary | ICD-10-CM | POA: Diagnosis not present

## 2018-05-20 DIAGNOSIS — G40909 Epilepsy, unspecified, not intractable, without status epilepticus: Secondary | ICD-10-CM | POA: Diagnosis not present

## 2018-05-20 DIAGNOSIS — D696 Thrombocytopenia, unspecified: Secondary | ICD-10-CM | POA: Diagnosis not present

## 2018-05-20 DIAGNOSIS — I890 Lymphedema, not elsewhere classified: Secondary | ICD-10-CM | POA: Diagnosis not present

## 2018-05-20 DIAGNOSIS — F319 Bipolar disorder, unspecified: Secondary | ICD-10-CM | POA: Diagnosis not present

## 2018-05-20 DIAGNOSIS — F0391 Unspecified dementia with behavioral disturbance: Secondary | ICD-10-CM | POA: Diagnosis not present

## 2018-05-21 DIAGNOSIS — I890 Lymphedema, not elsewhere classified: Secondary | ICD-10-CM | POA: Diagnosis not present

## 2018-05-22 DIAGNOSIS — I890 Lymphedema, not elsewhere classified: Secondary | ICD-10-CM | POA: Diagnosis not present

## 2018-05-25 DIAGNOSIS — I890 Lymphedema, not elsewhere classified: Secondary | ICD-10-CM | POA: Diagnosis not present

## 2018-05-26 DIAGNOSIS — I890 Lymphedema, not elsewhere classified: Secondary | ICD-10-CM | POA: Diagnosis not present

## 2018-05-27 DIAGNOSIS — I890 Lymphedema, not elsewhere classified: Secondary | ICD-10-CM | POA: Diagnosis not present

## 2018-05-28 DIAGNOSIS — I890 Lymphedema, not elsewhere classified: Secondary | ICD-10-CM | POA: Diagnosis not present

## 2018-06-17 DIAGNOSIS — I890 Lymphedema, not elsewhere classified: Secondary | ICD-10-CM | POA: Diagnosis not present

## 2018-06-17 DIAGNOSIS — E039 Hypothyroidism, unspecified: Secondary | ICD-10-CM | POA: Diagnosis not present

## 2018-06-17 DIAGNOSIS — F209 Schizophrenia, unspecified: Secondary | ICD-10-CM | POA: Diagnosis not present

## 2018-06-17 DIAGNOSIS — F319 Bipolar disorder, unspecified: Secondary | ICD-10-CM | POA: Diagnosis not present

## 2018-06-17 DIAGNOSIS — F0391 Unspecified dementia with behavioral disturbance: Secondary | ICD-10-CM | POA: Diagnosis not present

## 2018-06-17 DIAGNOSIS — E46 Unspecified protein-calorie malnutrition: Secondary | ICD-10-CM | POA: Diagnosis not present

## 2018-06-17 DIAGNOSIS — D696 Thrombocytopenia, unspecified: Secondary | ICD-10-CM | POA: Diagnosis not present

## 2018-06-23 DIAGNOSIS — F39 Unspecified mood [affective] disorder: Secondary | ICD-10-CM | POA: Diagnosis not present

## 2018-06-23 DIAGNOSIS — G259 Extrapyramidal and movement disorder, unspecified: Secondary | ICD-10-CM | POA: Diagnosis not present

## 2018-06-23 DIAGNOSIS — F329 Major depressive disorder, single episode, unspecified: Secondary | ICD-10-CM | POA: Diagnosis not present

## 2018-06-23 DIAGNOSIS — F209 Schizophrenia, unspecified: Secondary | ICD-10-CM | POA: Diagnosis not present

## 2018-07-13 ENCOUNTER — Non-Acute Institutional Stay: Payer: Medicare Other | Admitting: Primary Care

## 2018-07-13 DIAGNOSIS — Z515 Encounter for palliative care: Secondary | ICD-10-CM

## 2018-07-13 DIAGNOSIS — F259 Schizoaffective disorder, unspecified: Secondary | ICD-10-CM

## 2018-07-13 DIAGNOSIS — G40909 Epilepsy, unspecified, not intractable, without status epilepticus: Secondary | ICD-10-CM

## 2018-07-14 NOTE — Progress Notes (Signed)
PALLIATIVE CARE CONSULT VISIT   PATIENT NAME: Kristopher Pitts DOB: 1946/04/14 MRN: [id number]  PRIMARY CARE PROVIDER:   Angela Cox, MD  REFERRING PROVIDER:  Angela Cox, MD [phone] [place], [place] [address]  RESPONSIBLE PARTY:     ASSESSMENT:      Interview attempted but patient combative and agitated on introduction. Screamed that  he want to sleep. Roommate volunteered this behavior was not infrequent. Staff concurred. Staff stated that sometimes patient did cooperate with a known caregiver.   RECOMMENDATIONS and PLAN:  1. Will revisit and have a staff member assist with introduction and be present in the exam to reassure patient.  I spent 10 minutes providing this consultation,  from 1100 to 1110. More than 50% of the time in this consultation was spent coordinating communication.   HISTORY OF PRESENT ILLNESS:  [name] is a 72 y.o. year old male with multiple medical problems including schizophrenia, bipolar disorder,weakness, lymphedema, HTN, seizure disorder. Palliative Care was asked to help address goals of care.   CODE STATUS:   PPS: 40% HOSPICE ELIGIBILITY/DIAGNOSIS: TBD  PAST MEDICAL HISTORY:  Past Medical History:  Diagnosis Date  . Asthma   . Bipolar disorder, unspecified   . Chronic airway obstruction, not elsewhere classified   . Deep vein thrombophlebitis of leg, unspecified laterality   . Hereditary edema of legs   . Muscle weakness (generalized)   . Nonspecific abnormal toxicological findings   . Schizoaffective disorder, unspecified condition   . Unspecified epilepsy without mention of intractable epilepsy   . Unspecified essential hypertension   . Unspecified hypothyroidism     SOCIAL HX:  Social History   Tobacco Use  . Smoking status: Never Smoker  . Smokeless tobacco: Never Used  Substance Use Topics  . Alcohol use: No    ALLERGIES:  Allergies  Allergen Reactions  . Coumadin [Warfarin  Sodium]   . Lithium      PERTINENT MEDICATIONS:  Outpatient Encounter Medications as of 07/13/2018  Medication Sig  . AMBULATORY NON FORMULARY MEDICATION Medication Name: Lorazepam 1 mg /ML gel, apply 1 ML topically every 8 hours as needed  . benztropine (COGENTIN) 0.5 MG tablet Take 0.5 mg by mouth daily.  . clonazePAM (KLONOPIN) 0.5 MG tablet Take 1 tablet (0.5 mg total) by mouth 3 (three) times daily as needed for anxiety.  . divalproex (DEPAKOTE) 250 MG DR tablet Take 750 mg by mouth 2 (two) times daily. Take 3 tablets by mouth  daily  to equal 750 mg.  . finasteride (PROSCAR) 5 MG tablet Take 5 mg by mouth daily.  . fluPHENAZine (PROLIXIN) 10 MG tablet Take 10 mg by mouth 2 (two) times daily.  . furosemide (LASIX) 20 MG tablet Take 20 mg by mouth 3 (three) times daily.  . Iloperidone (FANAPT) 12 MG TABS Take 12 mg by mouth 2 (two) times daily.  Marland Kitchen ipratropium-albuterol (DUONEB) 0.5-2.5 (3) MG/3ML SOLN Take 3 mLs by nebulization as needed. For wheezing.  Marland Kitchen levothyroxine (SYNTHROID, LEVOTHROID) 75 MCG tablet Take 37.5 mcg by mouth daily.  Marland Kitchen olanzapine zydis (ZYPREXA) 15 MG disintegrating tablet Take 15 mg by mouth at bedtime.  Marland Kitchen oxybutynin (DITROPAN) 5 MG tablet Take 5 mg by mouth daily.  . phenytoin (DILANTIN) 100 MG ER capsule Take 100 mg by mouth 2 (two) times daily.  . potassium chloride (K-DUR,KLOR-CON) 10 MEQ tablet Take 10 mEq by mouth daily.  . Tamsulosin HCl (FLOMAX) 0.4 MG CAPS Take  0.4 mg by mouth daily.  Marland Kitchen topiramate (TOPAMAX) 100 MG tablet Take 100 mg by mouth daily.   No facility-administered encounter medications on file as of 07/13/2018.     PHYSICAL EXAM:    General: frail, alert but disoriented and agitated. Full exam deferred due to agitation. Cardiovascular: unable to assess Pulmonary: no cough, unable to assess lung sounds Extremities: 4+ pitting bilateral edema,  Skin: ruddy, intact on gross exam Neurological: Weakness, combative and agitated  Marijo File AGPCNP-BC

## 2018-08-18 ENCOUNTER — Non-Acute Institutional Stay: Payer: Medicare Other | Admitting: Primary Care

## 2018-08-18 DIAGNOSIS — Z515 Encounter for palliative care: Secondary | ICD-10-CM

## 2018-08-18 NOTE — Progress Notes (Addendum)
[facility] Telephone: [phone]) [phone] Fax: [phone]) [phone]  PATIENT NAME: Kristopher Pitts DOB: 05/19/1946 MRN: [id number]  PRIMARY CARE PROVIDER:   Angela Coxasanayaka, Gayani Y, MD   Dr. Nehemiah SettlePolite  REFERRING PROVIDER:  Angela Coxasanayaka, Gayani Y, MD [address] [place], [place] [id number]  RESPONSIBLE PARTY:     [name],[name] [phone]     ASSESSMENT and RECOMMENDATIONS:   1. Goals of Care: POA REQUESTS FULL CODE. MOST and DNR on chart, dated 2016. Staff report no changes with health status. Patient gave NP limited interview. Appears to be comfortable. Orders for pna work up on chart in [facility]  from last month.  T/c to  American FinancialBrother Bill  to discuss any desired changes; He states he wants full code and full scope of interventions. He is not sure how DNR order was taken. MOST form prepared and will be sent to him for signature. Will replace on facility chart when returned.  I spent 35 minutes providing this consultation,  from 09:45 to 10:20. More than 50% of the time in this consultation was spent coordinating communication.   HISTORY OF PRESENT ILLNESS:  Kristopher Pitts is a 72 y.o. year old male with multiple medical problems including COPD, bipolar disorder, schizoaffective disorder, edema. Palliative Care was asked to help address goals of care.   CODE STATUS: DNR, MOST with DNP, comfort measures, limited IV, Abx, no feeding tube.  PPS: 30% HOSPICE ELIGIBILITY/DIAGNOSIS: no  PAST MEDICAL HISTORY:  Past Medical History:  Diagnosis Date  . Asthma   . Bipolar disorder, unspecified   . Chronic airway obstruction, not elsewhere classified   . Deep vein thrombophlebitis of leg, unspecified laterality   . Hereditary edema of legs   . Muscle weakness (generalized)   . Nonspecific abnormal toxicological findings   . Schizoaffective disorder, unspecified condition   . Unspecified epilepsy without mention of intractable epilepsy   . Unspecified essential hypertension   .  Unspecified hypothyroidism     SOCIAL HX:  Social History   Tobacco Use  . Smoking status: Never Smoker  . Smokeless tobacco: Never Used  Substance Use Topics  . Alcohol use: No    ALLERGIES:  Allergies  Allergen Reactions  . Coumadin [Warfarin Sodium]   . Lithium      PERTINENT MEDICATIONS:  Outpatient Encounter Medications as of 08/18/2018  Medication Sig  . AMBULATORY NON FORMULARY MEDICATION Medication Name: Lorazepam 1 mg /ML gel, apply 1 ML topically every 8 hours as needed  . benztropine (COGENTIN) 0.5 MG tablet Take 0.5 mg by mouth daily.  . clonazePAM (KLONOPIN) 0.5 MG tablet Take 1 tablet (0.5 mg total) by mouth 3 (three) times daily as needed for anxiety.  . divalproex (DEPAKOTE) 250 MG DR tablet Take 750 mg by mouth 2 (two) times daily. Take 3 tablets by mouth  daily  to equal 750 mg.  . finasteride (PROSCAR) 5 MG tablet Take 5 mg by mouth daily.  . fluPHENAZine (PROLIXIN) 10 MG tablet Take 10 mg by mouth 2 (two) times daily.  . furosemide (LASIX) 20 MG tablet Take 20 mg by mouth 3 (three) times daily.  . Iloperidone (FANAPT) 12 MG TABS Take 12 mg by mouth 2 (two) times daily.  Marland Kitchen. ipratropium-albuterol (DUONEB) 0.5-2.5 (3) MG/3ML SOLN Take 3 mLs by nebulization as needed. For wheezing.  Marland Kitchen. levothyroxine (SYNTHROID, LEVOTHROID) 75 MCG tablet Take 37.5 mcg by mouth daily.  Marland Kitchen. olanzapine zydis (ZYPREXA) 15 MG disintegrating tablet Take 15 mg by mouth at  bedtime.  Marland Kitchen oxybutynin (DITROPAN) 5 MG tablet Take 5 mg by mouth daily.  . phenytoin (DILANTIN) 100 MG ER capsule Take 100 mg by mouth 2 (two) times daily.  . potassium chloride (K-DUR,KLOR-CON) 10 MEQ tablet Take 10 mEq by mouth daily.  . Tamsulosin HCl (FLOMAX) 0.4 MG CAPS Take 0.4 mg by mouth daily.  Marland Kitchen topiramate (TOPAMAX) 100 MG tablet Take 100 mg by mouth daily.   No facility-administered encounter medications on file as of 08/18/2018.     PHYSICAL EXAM:   VS 112/80-92-18  Wt in Nov 181, wt from 7/19=  186  General: NAD, frail appearing, WNWD Cardiovascular: regular rate and rhythm, S1S2 Pulmonary: clear all  Fields, no cough Abdomen: soft, nontender, + bowel sounds Extremities: Left leg with 4+ lymphedema, no joint deformities Skin: no rashes, wounds per treatment nurse Neurological: Weakness, dysarthria, Oriented x 1  Marijo File DNP, AGPCNP-BC

## 2018-08-18 NOTE — Addendum Note (Signed)
Addended by: Paulina FusiSMITH, Mehreen Azizi on: 08/18/2018 11:03 AM   Modules accepted: Level of Service

## 2018-09-14 ENCOUNTER — Non-Acute Institutional Stay: Payer: Medicare Other | Admitting: Primary Care

## 2018-09-15 ENCOUNTER — Non-Acute Institutional Stay: Payer: Medicare Other | Admitting: Primary Care

## 2018-09-15 DIAGNOSIS — Z515 Encounter for palliative care: Secondary | ICD-10-CM | POA: Diagnosis not present

## 2018-09-15 DIAGNOSIS — M6281 Muscle weakness (generalized): Secondary | ICD-10-CM | POA: Diagnosis not present

## 2018-09-15 DIAGNOSIS — F259 Schizoaffective disorder, unspecified: Secondary | ICD-10-CM | POA: Diagnosis not present

## 2018-09-15 NOTE — Progress Notes (Signed)
[facility] Telephone: [phone] Fax: [phone]  PATIENT NAME: Kristopher Pitts DOB: 1946/09/25 MRN: [id number]  PRIMARY CARE PROVIDER:   Renford Dills, MD  REFERRING PROVIDER:  Renford Dills, MD [address] [place], [place] [address]  RESPONSIBLE PARTY:   Extended Emergency Contact Information Primary Emergency Contact: Waltman,Bill  [place] Home Phone: [phone] Relation: Brother Secondary Emergency Contact: PATIENT,REFUSED TO G Address: WARNING DO NOT UPDATE          DO NOT UPDATE THIS [phone] Home Phone: [phone] Relation: None   ASSESSMENT nd RECOMMENDATIONS:   1.Goals of Care, discharge:  Patient is stable and ready to d/c from palliative. Obtained updated MOST form from brother and POA Izrael Peak via [place] mail. Placed in chart at [facility]. Discussed patient status with [facility] NP, Tonye Becket. Patient has not had status change since admission to palliative 3 years ago. Decided with [facility] to hand off patient at this time for [facility] f/u with patient and family. Offered service again at any time. Fleet Contras NP in Agreement with plan.  I spent 15 minutes providing this consultation,  from 1225 to 1240. More than 50% of the time in this consultation was spent coordinating communication.   HISTORY OF PRESENT ILLNESS:  Kristopher Pitts is a 72 y.o. year old male with multiple medical problems including schizophrenia, bipolar, COPD, LE lymphedema. Palliative Care was asked to help address goals of care.   CODE STATUS: FULL Code  PPS: 30% HOSPICE ELIGIBILITY/DIAGNOSIS: not yet eligible  PAST MEDICAL HISTORY:  Past Medical History:  Diagnosis Date  . Asthma   . Bipolar disorder, unspecified   . Chronic airway obstruction, not elsewhere classified   . Deep vein thrombophlebitis of leg, unspecified laterality   . Hereditary edema of legs   . Muscle weakness (generalized)   . Nonspecific abnormal toxicological  findings   . Schizoaffective disorder, unspecified condition   . Unspecified epilepsy without mention of intractable epilepsy   . Unspecified essential hypertension   . Unspecified hypothyroidism     SOCIAL HX:  Social History   Tobacco Use  . Smoking status: Never Smoker  . Smokeless tobacco: Never Used  Substance Use Topics  . Alcohol use: No    ALLERGIES:  Allergies  Allergen Reactions  . Coumadin [Warfarin Sodium]   . Lithium      PERTINENT MEDICATIONS:  Outpatient Encounter Medications as of 09/15/2018  Medication Sig  . AMBULATORY NON FORMULARY MEDICATION Medication Name: Lorazepam 1 mg /ML gel, apply 1 ML topically every 8 hours as needed  . benztropine (COGENTIN) 0.5 MG tablet Take 0.5 mg by mouth daily.  . clonazePAM (KLONOPIN) 0.5 MG tablet Take 1 tablet (0.5 mg total) by mouth 3 (three) times daily as needed for anxiety.  . divalproex (DEPAKOTE) 250 MG DR tablet Take 750 mg by mouth 2 (two) times daily. Take 3 tablets by mouth  daily  to equal 750 mg.  . finasteride (PROSCAR) 5 MG tablet Take 5 mg by mouth daily.  . fluPHENAZine (PROLIXIN) 10 MG tablet Take 10 mg by mouth 2 (two) times daily.  . furosemide (LASIX) 20 MG tablet Take 20 mg by mouth 3 (three) times daily.  . Iloperidone (FANAPT) 12 MG TABS Take 12 mg by mouth 2 (two) times daily.  Marland Kitchen ipratropium-albuterol (DUONEB) 0.5-2.5 (3) MG/3ML SOLN Take 3 mLs by nebulization as needed. For wheezing.  Marland Kitchen levothyroxine (SYNTHROID, LEVOTHROID) 75 MCG tablet Take 37.5 mcg by  mouth daily.  Marland Kitchen. olanzapine zydis (ZYPREXA) 15 MG disintegrating tablet Take 15 mg by mouth at bedtime.  Marland Kitchen. oxybutynin (DITROPAN) 5 MG tablet Take 5 mg by mouth daily.  . phenytoin (DILANTIN) 100 MG ER capsule Take 100 mg by mouth 2 (two) times daily.  . potassium chloride (K-DUR,KLOR-CON) 10 MEQ tablet Take 10 mEq by mouth daily.  . Tamsulosin HCl (FLOMAX) 0.4 MG CAPS Take 0.4 mg by mouth daily.  Marland Kitchen. topiramate (TOPAMAX) 100 MG tablet Take 100 mg by  mouth daily.   No facility-administered encounter medications on file as of 09/15/2018.     PHYSICAL EXAM:  VS deferred due to behavior  General: NAD, frail appearing, thin, edentulous. Cardiovascular: regular rate and rhythm, per radial pulse Pulmonary: no cough, cyanosis Abdomen: soft, normal morphology on gross exam Extremities:  Edema un-assessed , Skin: no rashes, wounds on gross exam Neurological: Weakness, memory loss, dysarthria Marijo FileKathryn M Naydeline Morace DNP, AGPCNP-BC

## 2018-10-22 ENCOUNTER — Non-Acute Institutional Stay: Payer: Medicare Other | Admitting: Primary Care

## 2018-10-22 DIAGNOSIS — F259 Schizoaffective disorder, unspecified: Secondary | ICD-10-CM | POA: Diagnosis not present

## 2018-10-22 DIAGNOSIS — Z515 Encounter for palliative care: Secondary | ICD-10-CM

## 2018-10-22 DIAGNOSIS — G40909 Epilepsy, unspecified, not intractable, without status epilepticus: Secondary | ICD-10-CM

## 2018-10-22 NOTE — Progress Notes (Signed)
[facility] Telephone: [phone] Fax: [phone]  PATIENT NAME: Kristopher Pitts DOB: 12-26-1945 MRN: [id number]  PRIMARY CARE PROVIDER:   Renford Dills, MD Tonye Becket, NP  REFERRING PROVIDER:  Renford Dills, MD [address] [place], [place] [address]  RESPONSIBLE PARTY:    Extended Emergency Contact Information Primary Emergency Contact: Goral,Bill  [place] Home Phone: [phone] Relation: Brother Secondary Emergency Contact: PATIENT,REFUSED TO G Address: WARNING DO NOT UPDATE          DO NOT UPDATE THIS [phone] Home Phone: [phone] Relation: None  ASSESSMENT and RECOMMENDATIONS:  Consultation request on patient we have seen in the past, for Tonye Becket, DNP, NP,  [facility] NP  at [facility]. Consultation requested o address goals of care, advance directives with POA, and symptom management.  1. Dysphasia:   a. ST referral and use of adaptive devices. Using proval cup, valve straw on order. Referred for swallowing study to be scheduled soon to establish extent and etiology of dysphagia. b. Dental Consultation: Needs examination for infections, salivation ability. Also pt is  edentulous, and getting dentures would help with mastication and should decrease some risk of aspiration.  c.  Attend to eating mechanics: Patient should sit in chair for all meals and remain sitting an hour after eating. Eating supine or even in semi fowlers will allow for increased opportunity for aspiration. d. Trial of metoclopramide if gastric emptying delay  is suspected but would implement the prior 3 recommendations first.   This is an ongoing problem patient has had multiple pneumonias in part from aspirating foods that have not been recommended, e.g. chips and soda drinks. Facility has instituted some adaptative equipment, i.e.  the Proval cup, and have a straw on order with a valve that delivers small sips at a time. Thickened liquids  are provided on his tray. Found patient today reclining and drinking coffee in this position. Education needed for staff to put patient in high Fowlers position to eat and leave him for an hour after eating to decrease episodes reflux and potential pneumonia. His choices have been discussed with his POA who states he supports his choice to eat and drink as he wishes.   Patient will have a swallow study soon at the hospital, a modified barium study. Palliative  will continue to follow and look forward to the results of the study.   2. Goals of care. Patient is full code, full interventions. Lengthy discussions with patient's brother who lives out of state and his POA from last fall. Will reach out again to discuss after results of the swallow study are in.  Palliative Care will continue to follow for goals of care discussion, symptom management. Return 3-4 weeks.  I spent 35 minutes providing this consultation,  from 1000 to 1035. More than 50% of the time in this consultation was spent coordinating communication.   HISTORY OF PRESENT ILLNESS:  Kristopher Pitts is a 73 y.o. year old male with multiple medical problems including seizure disorder, schizoaffective disorder, aspiration, frequent pneumonia. Palliative Care was asked to help address goals of care.   CODE STATUS: FULL  PPS: 30% HOSPICE ELIGIBILITY/DIAGNOSIS: TBD  PAST MEDICAL HISTORY:  Past Medical History:  Diagnosis Date  . Asthma   . Bipolar disorder, unspecified   . Chronic airway obstruction, not elsewhere classified   . Deep vein thrombophlebitis of leg, unspecified laterality   . Hereditary edema of legs   . Muscle weakness (generalized)   .  Nonspecific abnormal toxicological findings   . Schizoaffective disorder, unspecified condition   . Unspecified epilepsy without mention of intractable epilepsy   . Unspecified essential hypertension   . Unspecified hypothyroidism     SOCIAL HX:  Social History   Tobacco Use  .  Smoking status: Never Smoker  . Smokeless tobacco: Never Used  Substance Use Topics  . Alcohol use: No    ALLERGIES:  Allergies  Allergen Reactions  . Coumadin [Warfarin Sodium]   . Lithium      PERTINENT MEDICATIONS:  Outpatient Encounter Medications as of 10/22/2018  Medication Sig  . AMBULATORY NON FORMULARY MEDICATION Medication Name: Lorazepam 1 mg /ML gel, apply 1 ML topically every 8 hours as needed  . benztropine (COGENTIN) 0.5 MG tablet Take 0.5 mg by mouth daily.  . clonazePAM (KLONOPIN) 0.5 MG tablet Take 1 tablet (0.5 mg total) by mouth 3 (three) times daily as needed for anxiety.  . divalproex (DEPAKOTE) 250 MG DR tablet Take 750 mg by mouth 2 (two) times daily. Take 3 tablets by mouth  daily  to equal 750 mg.  . finasteride (PROSCAR) 5 MG tablet Take 5 mg by mouth daily.  . fluPHENAZine (PROLIXIN) 10 MG tablet Take 10 mg by mouth 2 (two) times daily.  . furosemide (LASIX) 20 MG tablet Take 20 mg by mouth 3 (three) times daily.  . Iloperidone (FANAPT) 12 MG TABS Take 12 mg by mouth 2 (two) times daily.  Marland Kitchen ipratropium-albuterol (DUONEB) 0.5-2.5 (3) MG/3ML SOLN Take 3 mLs by nebulization as needed. For wheezing.  Marland Kitchen levothyroxine (SYNTHROID, LEVOTHROID) 75 MCG tablet Take 37.5 mcg by mouth daily.  Marland Kitchen olanzapine zydis (ZYPREXA) 15 MG disintegrating tablet Take 15 mg by mouth at bedtime.  Marland Kitchen oxybutynin (DITROPAN) 5 MG tablet Take 5 mg by mouth daily.  . phenytoin (DILANTIN) 100 MG ER capsule Take 100 mg by mouth 2 (two) times daily.  . potassium chloride (K-DUR,KLOR-CON) 10 MEQ tablet Take 10 mEq by mouth daily.  . Tamsulosin HCl (FLOMAX) 0.4 MG CAPS Take 0.4 mg by mouth daily.  Marland Kitchen topiramate (TOPAMAX) 100 MG tablet Take 100 mg by mouth daily.   No facility-administered encounter medications on file as of 10/22/2018.     PHYSICAL EXAM:  VS deferred General: NAD, frail appearing, thin, wary affect HEENT: Edentulous, h/o aspiration, for radiologic assessment Cardiovascular:  regular rate and rhythm per peripheral Pulmonary: unable to assess Abdomen: soft, nontender, + bowel sounds Extremities: + LE edema, LE joint deformities Skin: no rashes, skin tears right forearms Neurological: Weakness, psychiatric sequelae.  Marijo File DNP, AGPCNP-BC

## 2018-10-25 ENCOUNTER — Other Ambulatory Visit (HOSPITAL_COMMUNITY): Payer: Self-pay | Admitting: *Deleted

## 2018-10-25 DIAGNOSIS — R1310 Dysphagia, unspecified: Secondary | ICD-10-CM

## 2018-11-09 ENCOUNTER — Ambulatory Visit (HOSPITAL_COMMUNITY)
Admission: RE | Admit: 2018-11-09 | Discharge: 2018-11-09 | Disposition: A | Discharge status: Home / Self Care | Payer: Medicare Other | Source: Ambulatory Visit | Attending: Internal Medicine | Admitting: Internal Medicine

## 2018-11-09 DIAGNOSIS — R1310 Dysphagia, unspecified: Secondary | ICD-10-CM | POA: Diagnosis not present

## 2018-11-22 ENCOUNTER — Non-Acute Institutional Stay: Payer: Medicare Other | Admitting: Adult Health Nurse Practitioner

## 2018-11-22 DIAGNOSIS — Z515 Encounter for palliative care: Secondary | ICD-10-CM

## 2018-11-22 NOTE — Progress Notes (Signed)
Therapist, nutritional Palliative Care Consult Note Telephone: [phone]  Fax: [phone] 11/22/2018  PATIENT NAME: Kristopher Pitts DOB: Oct 25, 1945 MRN: [id number]  PRIMARY CARE PROVIDER:   Renford Dills, MD  REFERRING PROVIDER:  Renford Dills, MD [address] [place], [place] [address]  RESPONSIBLE PARTY:   Extended Emergency Contact Information Primary Emergency Contact: Kristopher Pitts,Kristopher Pitts  [place] Home Phone: [phone] Relation: Brother        RECOMMENDATIONS and PLAN:  1.  Dysphagia.  Patient had swallowing study done earlier this month and is considered a moderate aspiration risk.  Patient is already on thickened liquids but staff reports that he frequently goes to vending machine to get sodas.  He has provale cup and straw for use with thin liquids.  Has not had any treatment for aspiration pneumonia since last visit. Continue recommendations for patient to be sitting up fully in chair while eating and for about an hour after eating.    2. Goals of care. Patient is full code, full interventions.  Left message with brother to call with any questions, concerns, and to discuss any updates on patient status  I spent 35 minutes providing this consultation,  from 8:45 to 9:20. More than 50% of the time in this consultation was spent coordinating communication. Assessed patient, spoke with staff, and left message for brother.  HISTORY OF PRESENT ILLNESS: Kristopher Pitts is a 73 y.o. year old male with multiple medical problems including seizure disorder, schizoaffective disorder, aspiration, frequent pneumonia. Palliative Care was asked to help address goals of care.   CODE STATUS: Full  PPS: 30% HOSPICE ELIGIBILITY/DIAGNOSIS: TBD  PHYSICAL EXAM:   General: NAD, frail appearing, thin Cardiovascular: regular rate and rhythm Pulmonary: clear ant fields Abdomen: soft, nontender, + bowel sounds GU: no suprapubic  tenderness Extremities: +LE edema: staff does report that the edema is better when he is lying in bed and gets worse as he sits up more throughout the day and drinks sodas Neurological: Patient's speech is indiscernable at times and he does follow most commands.    PAST MEDICAL HISTORY:  Past Medical History:  Diagnosis Date  . Asthma   . Bipolar disorder, unspecified   . Chronic airway obstruction, not elsewhere classified   . Deep vein thrombophlebitis of leg, unspecified laterality   . Hereditary edema of legs   . Muscle weakness (generalized)   . Nonspecific abnormal toxicological findings   . Schizoaffective disorder, unspecified condition   . Unspecified epilepsy without mention of intractable epilepsy   . Unspecified essential hypertension   . Unspecified hypothyroidism     SOCIAL HX:  Social History   Tobacco Use  . Smoking status: Never Smoker  . Smokeless tobacco: Never Used  Substance Use Topics  . Alcohol use: No    ALLERGIES:  Allergies  Allergen Reactions  . Coumadin [Warfarin Sodium]   . Lithium      PERTINENT MEDICATIONS:  Outpatient Encounter Medications as of 11/22/2018  Medication Sig  . AMBULATORY NON FORMULARY MEDICATION Medication Name: Lorazepam 1 mg /ML gel, apply 1 ML topically every 8 hours as needed  . benztropine (COGENTIN) 0.5 MG tablet Take 0.5 mg by mouth daily.  . clonazePAM (KLONOPIN) 0.5 MG tablet Take 1 tablet (0.5 mg total) by mouth 3 (three) times daily as needed for anxiety.  . divalproex (DEPAKOTE) 250 MG DR tablet Take 750 mg by mouth 2 (two) times daily. Take 3 tablets by mouth  daily  to equal 750 mg.  . finasteride (PROSCAR) 5 MG tablet Take 5 mg by mouth daily.  . fluPHENAZine (PROLIXIN) 10 MG tablet Take 10 mg by mouth 2 (two) times daily.  . furosemide (LASIX) 20 MG tablet Take 20 mg by mouth 3 (three) times daily.  . Iloperidone (FANAPT) 12 MG TABS Take 12 mg by mouth 2 (two) times daily.  Marland Kitchen ipratropium-albuterol (DUONEB)  0.5-2.5 (3) MG/3ML SOLN Take 3 mLs by nebulization as needed. For wheezing.  Marland Kitchen levothyroxine (SYNTHROID, LEVOTHROID) 75 MCG tablet Take 37.5 mcg by mouth daily.  Marland Kitchen olanzapine zydis (ZYPREXA) 15 MG disintegrating tablet Take 15 mg by mouth at bedtime.  Marland Kitchen oxybutynin (DITROPAN) 5 MG tablet Take 5 mg by mouth daily.  . phenytoin (DILANTIN) 100 MG ER capsule Take 100 mg by mouth 2 (two) times daily.  . potassium chloride (K-DUR,KLOR-CON) 10 MEQ tablet Take 10 mEq by mouth daily.  . Tamsulosin HCl (FLOMAX) 0.4 MG CAPS Take 0.4 mg by mouth daily.  Marland Kitchen topiramate (TOPAMAX) 100 MG tablet Take 100 mg by mouth daily.   No facility-administered encounter medications on file as of 11/22/2018.       Marlena Clipper, NP

## 2018-12-21 ENCOUNTER — Non-Acute Institutional Stay: Payer: Medicare Other | Admitting: Adult Health Nurse Practitioner

## 2018-12-21 ENCOUNTER — Other Ambulatory Visit: Payer: Self-pay

## 2018-12-21 DIAGNOSIS — Z515 Encounter for palliative care: Secondary | ICD-10-CM

## 2018-12-21 NOTE — Progress Notes (Signed)
Therapist, nutritional Palliative Care Consult Note Telephone: [phone]  Fax: [phone]  PATIENT NAME: Kristopher Pitts DOB: 03/20/1946 MRN: [id number]  PRIMARY CARE PROVIDER:   Renford Dills, MD  REFERRING PROVIDER:  Renford Dills, MD [address] [place], [place] [address]  RESPONSIBLE PARTY:  Extended Emergency Contact Information Primary Emergency Contact: Ellwood,Bill [place] Home Phone: [phone] Relation: Brother     RECOMMENDATIONS and PLAN:  1. Dysphagia.  Patient moderate aspiration risk.  Patient is  on thickened liquids. Staff reports that he continues to be noncompliant with thickened liquids.  He has provale cup and straw for use with thin liquids.  Has not had any treatment for aspiration pneumonia since last visit. Continue recommendations for patient to be sitting up fully in chair while eating and for about an hour after eating.    2. Goals of care. Earlier this week spoke with brother and PCP and confirmed DNR and filled out MOST form to include limited interventions but to treat aggressively with antibiotics, IV fluids, PT/OT/ST and if patient not responding will consider transitioning to comfort care.  Does want feeding tube long term if necessary.  I spent 20 minutes providing this consultation,  from 11:00 to 11:20. More than 50% of the time in this consultation was spent coordinating communication.   HISTORY OF PRESENT ILLNESS:  Kristopher Pitts is a 73 y.o. year old male with multiple medical problems including seizure disorder, schizoaffective disorder, aspiration, frequent pneumonia. Palliative Care was asked to help address goals of care.   CODE STATUS: DNR  PPS: 30% HOSPICE ELIGIBILITY/DIAGNOSIS: TBD  PHYSICAL EXAM:  Labs on 12/06/2018 showed albumin of 2.8 and total protein of 5.4 General: NAD, frail appearing, thin Cardiovascular: regular rate and rhythm Pulmonary: clear ant fields  Abdomen: soft, nontender, + bowel sounds GU: no suprapubic tenderness Extremities: +LE edema Neurological: Patient's speech is indiscernable at times and he does follow most commands.    PAST MEDICAL HISTORY:  Past Medical History:  Diagnosis Date  . Asthma   . Bipolar disorder, unspecified   . Chronic airway obstruction, not elsewhere classified   . Deep vein thrombophlebitis of leg, unspecified laterality   . Hereditary edema of legs   . Muscle weakness (generalized)   . Nonspecific abnormal toxicological findings   . Schizoaffective disorder, unspecified condition   . Unspecified epilepsy without mention of intractable epilepsy   . Unspecified essential hypertension   . Unspecified hypothyroidism     SOCIAL HX:  Social History   Tobacco Use  . Smoking status: Never Smoker  . Smokeless tobacco: Never Used  Substance Use Topics  . Alcohol use: No    ALLERGIES:  Allergies  Allergen Reactions  . Coumadin [Warfarin Sodium]   . Lithium      PERTINENT MEDICATIONS:  Outpatient Encounter Medications as of 12/21/2018  Medication Sig  . AMBULATORY NON FORMULARY MEDICATION Medication Name: Lorazepam 1 mg /ML gel, apply 1 ML topically every 8 hours as needed  . benztropine (COGENTIN) 0.5 MG tablet Take 0.5 mg by mouth daily.  . clonazePAM (KLONOPIN) 0.5 MG tablet Take 1 tablet (0.5 mg total) by mouth 3 (three) times daily as needed for anxiety.  . divalproex (DEPAKOTE) 250 MG DR tablet Take 750 mg by mouth 2 (two) times daily. Take 3 tablets by mouth  daily  to equal 750 mg.  . finasteride (PROSCAR) 5 MG tablet Take 5 mg by mouth daily.  . fluPHENAZine (PROLIXIN)  10 MG tablet Take 10 mg by mouth 2 (two) times daily.  . furosemide (LASIX) 20 MG tablet Take 20 mg by mouth 3 (three) times daily.  . Iloperidone (FANAPT) 12 MG TABS Take 12 mg by mouth 2 (two) times daily.  Marland Kitchen ipratropium-albuterol (DUONEB) 0.5-2.5 (3) MG/3ML SOLN Take 3 mLs by nebulization as needed. For wheezing.  Marland Kitchen  levothyroxine (SYNTHROID, LEVOTHROID) 75 MCG tablet Take 37.5 mcg by mouth daily.  Marland Kitchen olanzapine zydis (ZYPREXA) 15 MG disintegrating tablet Take 15 mg by mouth at bedtime.  Marland Kitchen oxybutynin (DITROPAN) 5 MG tablet Take 5 mg by mouth daily.  . phenytoin (DILANTIN) 100 MG ER capsule Take 100 mg by mouth 2 (two) times daily.  . potassium chloride (K-DUR,KLOR-CON) 10 MEQ tablet Take 10 mEq by mouth daily.  . Tamsulosin HCl (FLOMAX) 0.4 MG CAPS Take 0.4 mg by mouth daily.  Marland Kitchen topiramate (TOPAMAX) 100 MG tablet Take 100 mg by mouth daily.   No facility-administered encounter medications on file as of 12/21/2018.      Amy Marlena Clipper, NP

## 2019-04-27 ENCOUNTER — Non-Acute Institutional Stay: Payer: Medicare Other | Admitting: Adult Health Nurse Practitioner

## 2019-04-27 DIAGNOSIS — Z515 Encounter for palliative care: Secondary | ICD-10-CM

## 2019-04-27 NOTE — Progress Notes (Signed)
[facility] Consult Note Telephone: [phone]  Fax: [phone]  PATIENT NAME: Kristopher Pitts DOB: 09-12-1946 MRN: [id number]  PRIMARY CARE PROVIDER:   Seward Carol, MD  REFERRING PROVIDER:  Seward Carol, MD [address] [place],  [place] [address]  RESPONSIBLE PARTY:   Extended Emergency Contact Information Primary Emergency Contact: Kavan,Bill [place] Phone: [phone] Relation: Brother   Due to the COVID-19 crisis, this telephone evaluation and treatment contact was done via telephone and it was initiated and consent by this patient and or family.    RECOMMENDATIONS and PLAN:  Spoke with provider about patient on the phone.  Patient had COVID and was transferred to a sister facility until he tested negative twice.  He is now back at [facility].  Still using oxygen.  He is currently being treated for aspiration pneumonia.  Provider indicated that she may be interested in doing CT of the chest to see if the opacities on the xrays are truly aspiration pneumonia or if it could be something else.  Discussed that the only benefit is that he may not be treated repeatedly for aspiration pneumonia as he would not be a candidate for any aggressive treatment. Though patient is on aspiration precautions but he is noncompliant with those precautions.    I spent 15 minutes providing this consultation,  from 3:45 to 4:00. More than 50% of the time in this consultation was spent coordinating communication.   HISTORY OF PRESENT ILLNESS:  Kristopher Pitts is a 73 y.o. year old male with multiple medical problems including seizure disorder, schizoaffective disorder, aspiration, frequent pneumonia. Palliative Care was asked to help address goals of care.   CODE STATUS: DNR  PPS: 30% HOSPICE ELIGIBILITY/DIAGNOSIS: TBD  PAST MEDICAL HISTORY:  Past Medical History:  Diagnosis Date  . Asthma   . Bipolar  disorder, unspecified   . Chronic airway obstruction, not elsewhere classified   . Deep vein thrombophlebitis of leg, unspecified laterality   . Hereditary edema of legs   . Muscle weakness (generalized)   . Nonspecific abnormal toxicological findings   . Schizoaffective disorder, unspecified condition   . Unspecified epilepsy without mention of intractable epilepsy   . Unspecified essential hypertension   . Unspecified hypothyroidism     SOCIAL HX:  Social History   Tobacco Use  . Smoking status: Never Smoker  . Smokeless tobacco: Never Used  Substance Use Topics  . Alcohol use: No    ALLERGIES:  Allergies  Allergen Reactions  . Coumadin [Warfarin Sodium]   . Lithium      PERTINENT MEDICATIONS:  Outpatient Encounter Medications as of 04/27/2019  Medication Sig  . AMBULATORY NON FORMULARY MEDICATION Medication Name: Lorazepam 1 mg /ML gel, apply 1 ML topically every 8 hours as needed  . benztropine (COGENTIN) 0.5 MG tablet Take 0.5 mg by mouth daily.  . clonazePAM (KLONOPIN) 0.5 MG tablet Take 1 tablet (0.5 mg total) by mouth 3 (three) times daily as needed for anxiety.  . divalproex (DEPAKOTE) 250 MG DR tablet Take 750 mg by mouth 2 (two) times daily. Take 3 tablets by mouth  daily  to equal 750 mg.  . finasteride (PROSCAR) 5 MG tablet Take 5 mg by mouth daily.  . fluPHENAZine (PROLIXIN) 10 MG tablet Take 10 mg by mouth 2 (two) times daily.  . furosemide (LASIX) 20 MG tablet Take 20 mg by mouth 3 (three) times daily.  . Iloperidone (FANAPT)  12 MG TABS Take 12 mg by mouth 2 (two) times daily.  Marland Kitchen. ipratropium-albuterol (DUONEB) 0.5-2.5 (3) MG/3ML SOLN Take 3 mLs by nebulization as needed. For wheezing.  Marland Kitchen. levothyroxine (SYNTHROID, LEVOTHROID) 75 MCG tablet Take 37.5 mcg by mouth daily.  Marland Kitchen. olanzapine zydis (ZYPREXA) 15 MG disintegrating tablet Take 15 mg by mouth at bedtime.  Marland Kitchen. oxybutynin (DITROPAN) 5 MG tablet Take 5 mg by mouth daily.  . phenytoin (DILANTIN) 100 MG ER capsule  Take 100 mg by mouth 2 (two) times daily.  . potassium chloride (K-DUR,KLOR-CON) 10 MEQ tablet Take 10 mEq by mouth daily.  . Tamsulosin HCl (FLOMAX) 0.4 MG CAPS Take 0.4 mg by mouth daily.  Marland Kitchen. topiramate (TOPAMAX) 100 MG tablet Take 100 mg by mouth daily.   No facility-administered encounter medications on file as of 04/27/2019.       Kahlie Deutscher Marlena ClipperK Holman Bonsignore, NP

## 2019-04-28 ENCOUNTER — Other Ambulatory Visit: Payer: Self-pay

## 2019-10-21 ENCOUNTER — Other Ambulatory Visit: Payer: Self-pay

## 2019-10-21 ENCOUNTER — Non-Acute Institutional Stay: Payer: Medicare Other | Admitting: Hospice

## 2019-10-21 DIAGNOSIS — Z515 Encounter for palliative care: Secondary | ICD-10-CM

## 2019-10-21 DIAGNOSIS — I739 Peripheral vascular disease, unspecified: Secondary | ICD-10-CM

## 2019-10-21 NOTE — Progress Notes (Addendum)
[facility] Consult Note Telephone: [phone]  Fax: [phone]  PATIENT NAME: Kristopher Pitts DOB: 12/27/1945 MRN: [id number]  PRIMARY CARE PROVIDER:   Seward Carol, MD  REFERRING PROVIDER:  Seward Carol, MD [address] [place],  [place] [address]  RESPONSIBLE PARTY:   Extended Emergency Contact Information Primary Emergency Contact: [place],Bill [place] Phone: [phone] Relation: Brother    RECOMMENDATIONS/PLAN:   Advance Care Planning/Goals of Care: Visit consisted of building trust and discussions on Palliative Medicine as specialized medical care for people living with serious illness, aimed at facilitating better quality of life through symptoms relief, assisting with advance care plan and establishing goals of care.  Patient with cognitive deficits. He is currently a DNR, DNR and MOST form in the facility chart; uploaded DNR and MOST forms to Epic today. [employer] and updated him with visit. He expressed appreciation for the update.  Symptom management: Spoke with wound nurse who reported bilateral lower extremity edema/wounds improving with change in treatment. Use of calcium alginate discontinued. Patient gets [employer] weekly, with good result and improvement. Encouraged elevation. No report of recent pneumonia or seizures. Continues to be followed by Psych for schizoaffective disorder and Dementia. FAST 6d, incontinent of bowel and bladder. Encouraged ongoing nursing care. Follow up: Palliative care will continue to follow patient for goals of care clarification and symptom management. I spent 30  minutes providing this consultation.  More than 50% of the time in this consultation was spent on coordinating communication   HISTORY OF PRESENT ILLNESS:  Kristopher Pitts is a 74 y.o. year old male with multiple medical problems including seizure disorder, Chronic edema of bil lower  extremities r/t PVD, Dementia, schizoaffective disorder, aspiration, frequent pneumonia. Palliative Care was asked to help address goals of care.    CODE STATUS: DNR  PPS: 30% HOSPICE ELIGIBILITY/DIAGNOSIS: TBD  PAST MEDICAL HISTORY:  Past Medical History:  Diagnosis Date  . Asthma   . Bipolar disorder, unspecified   . Chronic airway obstruction, not elsewhere classified   . Deep vein thrombophlebitis of leg, unspecified laterality   . Hereditary edema of legs   . Muscle weakness (generalized)   . Nonspecific abnormal toxicological findings   . Schizoaffective disorder, unspecified condition   . Unspecified epilepsy without mention of intractable epilepsy   . Unspecified essential hypertension   . Unspecified hypothyroidism     SOCIAL HX:  Social History   Tobacco Use  . Smoking status: Never Smoker  . Smokeless tobacco: Never Used  Substance Use Topics  . Alcohol use: No    ALLERGIES:  Allergies  Allergen Reactions  . Coumadin [Warfarin Sodium]   . Lithium      PERTINENT MEDICATIONS:  Outpatient Encounter Medications as of 10/21/2019  Medication Sig  . AMBULATORY NON FORMULARY MEDICATION Medication Name: Lorazepam 1 mg /ML gel, apply 1 ML topically every 8 hours as needed  . benztropine (COGENTIN) 0.5 MG tablet Take 0.5 mg by mouth daily.  . clonazePAM (KLONOPIN) 0.5 MG tablet Take 1 tablet (0.5 mg total) by mouth 3 (three) times daily as needed for anxiety.  . divalproex (DEPAKOTE) 250 MG DR tablet Take 750 mg by mouth 2 (two) times daily. Take 3 tablets by mouth  daily  to equal 750 mg.  . finasteride (PROSCAR) 5 MG tablet Take 5 mg by mouth daily.  . fluPHENAZine (PROLIXIN) 10 MG tablet Take 10 mg by mouth 2 (two) times  daily.  . furosemide (LASIX) 20 MG tablet Take 20 mg by mouth 3 (three) times daily.  . Iloperidone (FANAPT) 12 MG TABS Take 12 mg by mouth 2 (two) times daily.  Marland Kitchen ipratropium-albuterol (DUONEB) 0.5-2.5 (3) MG/3ML SOLN Take 3 mLs by nebulization as  needed. For wheezing.  Marland Kitchen levothyroxine (SYNTHROID, LEVOTHROID) 75 MCG tablet Take 37.5 mcg by mouth daily.  Marland Kitchen olanzapine zydis (ZYPREXA) 15 MG disintegrating tablet Take 15 mg by mouth at bedtime.  Marland Kitchen oxybutynin (DITROPAN) 5 MG tablet Take 5 mg by mouth daily.  . phenytoin (DILANTIN) 100 MG ER capsule Take 100 mg by mouth 2 (two) times daily.  . potassium chloride (K-DUR,KLOR-CON) 10 MEQ tablet Take 10 mEq by mouth daily.  . Tamsulosin HCl (FLOMAX) 0.4 MG CAPS Take 0.4 mg by mouth daily.  Marland Kitchen topiramate (TOPAMAX) 100 MG tablet Take 100 mg by mouth daily.   No facility-administered encounter medications on file as of 10/21/2019.    PHYSICAL EXAM/ROS:   General: NAD. Did not want his lower extremities uncovered Cardiovascular: regular rate and rhythm; denied chest pain Pulmonary: no cough, no SOB, normal respiratory effort Abdomen: soft, nontender Skin: no rashes to exposed skin Neurological: Weakness but otherwise nonfocal  Rosaura Carpenter, NP

## 2019-12-05 ENCOUNTER — Other Ambulatory Visit: Payer: Self-pay

## 2019-12-05 ENCOUNTER — Emergency Department (HOSPITAL_COMMUNITY)
Admission: EM | Admit: 2019-12-05 | Discharge: 2019-12-05 | Disposition: A | Discharge status: Skilled Nursing Facility | Payer: Medicare Other | Attending: Emergency Medicine | Admitting: Emergency Medicine

## 2019-12-05 ENCOUNTER — Encounter (HOSPITAL_COMMUNITY): Payer: Self-pay

## 2019-12-05 ENCOUNTER — Emergency Department (HOSPITAL_COMMUNITY): Payer: Medicare Other

## 2019-12-05 DIAGNOSIS — Y999 Unspecified external cause status: Secondary | ICD-10-CM | POA: Insufficient documentation

## 2019-12-05 DIAGNOSIS — F0390 Unspecified dementia without behavioral disturbance: Secondary | ICD-10-CM | POA: Diagnosis not present

## 2019-12-05 DIAGNOSIS — Y9389 Activity, other specified: Secondary | ICD-10-CM | POA: Insufficient documentation

## 2019-12-05 DIAGNOSIS — Y92129 Unspecified place in nursing home as the place of occurrence of the external cause: Secondary | ICD-10-CM | POA: Diagnosis not present

## 2019-12-05 DIAGNOSIS — S72344A Nondisplaced spiral fracture of shaft of right femur, initial encounter for closed fracture: Secondary | ICD-10-CM | POA: Insufficient documentation

## 2019-12-05 DIAGNOSIS — M79604 Pain in right leg: Secondary | ICD-10-CM | POA: Insufficient documentation

## 2019-12-05 DIAGNOSIS — W19XXXA Unspecified fall, initial encounter: Secondary | ICD-10-CM

## 2019-12-05 DIAGNOSIS — S72401A Unspecified fracture of lower end of right femur, initial encounter for closed fracture: Secondary | ICD-10-CM

## 2019-12-05 DIAGNOSIS — S79921A Unspecified injury of right thigh, initial encounter: Secondary | ICD-10-CM | POA: Diagnosis present

## 2019-12-05 DIAGNOSIS — I10 Essential (primary) hypertension: Secondary | ICD-10-CM | POA: Insufficient documentation

## 2019-12-05 DIAGNOSIS — W06XXXA Fall from bed, initial encounter: Secondary | ICD-10-CM | POA: Diagnosis not present

## 2019-12-05 LAB — BASIC METABOLIC PANEL
Anion gap: 9 (ref 5–15)
BUN: 29 mg/dL — ABNORMAL HIGH (ref 8–23)
CO2: 29 mmol/L (ref 22–32)
Calcium: 8.6 mg/dL — ABNORMAL LOW (ref 8.9–10.3)
Chloride: 106 mmol/L (ref 98–111)
Creatinine, Ser: 0.63 mg/dL (ref 0.61–1.24)
GFR calc Af Amer: >60 mL/min (ref >60)
GFR calc non Af Amer: >60 mL/min (ref >60)
Glucose, Bld: 113 mg/dL — ABNORMAL HIGH (ref 70–99)
Potassium: 3.8 mmol/L (ref 3.5–5.1)
Sodium: 144 mmol/L (ref 135–145)

## 2019-12-05 LAB — CBC WITH DIFFERENTIAL/PLATELET
Abs Immature Granulocytes: 0.19 10*3/uL — ABNORMAL HIGH (ref 0.00–0.07)
Basophils Absolute: 0.1 10*3/uL (ref 0.0–0.1)
Basophils Relative: 1 %
Eosinophils Absolute: 0.1 10*3/uL (ref 0.0–0.5)
Eosinophils Relative: 1 %
HCT: 48.3 % (ref 39.0–52.0)
Hemoglobin: 14.8 g/dL (ref 13.0–17.0)
Immature Granulocytes: 2 %
Lymphocytes Relative: 14 %
Lymphs Abs: 1.2 10*3/uL (ref 0.7–4.0)
MCH: 25 pg — ABNORMAL LOW (ref 26.0–34.0)
MCHC: 30.6 g/dL (ref 30.0–36.0)
MCV: 81.7 fL (ref 80.0–100.0)
Monocytes Absolute: 1.2 10*3/uL — ABNORMAL HIGH (ref 0.1–1.0)
Monocytes Relative: 13 %
Neutro Abs: 6.2 10*3/uL (ref 1.7–7.7)
Neutrophils Relative %: 69 %
Platelets: 438 10*3/uL — ABNORMAL HIGH (ref 150–400)
RBC: 5.91 MIL/uL — ABNORMAL HIGH (ref 4.22–5.81)
RDW: 17.1 % — ABNORMAL HIGH (ref 11.5–15.5)
WBC: 9.1 10*3/uL (ref 4.0–10.5)
nRBC: 0 % (ref 0.0–0.2)

## 2019-12-05 LAB — PROTIME-INR
INR: 1.1 (ref 0.8–1.2)
Prothrombin Time: 13.6 seconds (ref 11.4–15.2)

## 2019-12-05 NOTE — ED Notes (Signed)
Update given to Beverely Pace, NP of [facility].

## 2019-12-05 NOTE — Discharge Instructions (Addendum)
Knee immobilizer to R LE when in bed.  OK to remove for bathing and skin checks.  Follow-up with Dr. Victorino Pitts in 1 month.  His contact information has been provided in this discharge summary for you to call and make these arrangements.

## 2019-12-05 NOTE — ED Provider Notes (Addendum)
[facility]-EMERGENCY DEPT Provider Note   CSN: [id number] Arrival date & time: 12/05/19  1806     History Chief Complaint  Patient presents with  . Fall  . Hip Injury    Kristopher Pitts is a 74 y.o. male.  Patient is a 74 year old male with history of dementia, schizophrenia, hypertension.  Patient brought from his extended care facility for evaluation of a leg fracture.  Patient apparently fell out of his bed this morning.  He is nonverbal, but appeared to be having discomfort in his right leg.  X-rays obtained there showed a distal femur fracture and patient was sent here for further evaluation.  Patient adds no additional history secondary to dementia.  The history is provided by the patient and the nursing home.  Fall This is a new problem. Episode onset: This morning. The problem occurs constantly. The problem has not changed since onset.Nothing aggravates the symptoms. Nothing relieves the symptoms.       Past Medical History:  Diagnosis Date  . Asthma   . Bipolar disorder, unspecified (HCC)   . Chronic airway obstruction, not elsewhere classified   . Deep vein thrombophlebitis of leg, unspecified laterality (HCC)   . Hereditary edema of legs   . Muscle weakness (generalized)   . Nonspecific abnormal toxicological findings   . Schizoaffective disorder, unspecified condition   . Unspecified epilepsy without mention of intractable epilepsy   . Unspecified essential hypertension   . Unspecified hypothyroidism     Patient Active Problem List   Diagnosis Date Noted  . Seizure disorder (HCC) 04/20/2014  . Other schizophrenia (HCC) 03/20/2014  . Essential hypertension 03/20/2014  . HTN (hypertension) 02/23/2014  . Edema 02/03/2014  . Dysphagia, unspecified(787.20) 11/11/2013  . Muscle weakness 11/11/2013  . Cough 10/07/2013  . Pneumonia, organism unspecified(486) 09/26/2013  . Other convulsions 08/18/2013  . Conjunctivitis unspecified 06/29/2013   . Other lymphedema 04/08/2013  . Hypothyroidism 04/08/2013  . Essential hypertension, benign 04/08/2013  . Schizophrenia (HCC) 04/08/2013    Past Surgical History:  Procedure Laterality Date  . none         History reviewed. No pertinent family history.  Social History   Tobacco Use  . Smoking status: Never Smoker  . Smokeless tobacco: Never Used  Substance Use Topics  . Alcohol use: No  . Drug use: Not on file    Home Medications Prior to Admission medications   Medication Sig Start Date End Date Taking? Authorizing Provider  AMBULATORY NON FORMULARY MEDICATION Medication Name: Lorazepam 1 mg /ML gel, apply 1 ML topically every 8 hours as needed 06/21/13   Sharon Seller, NP  benztropine (COGENTIN) 0.5 MG tablet Take 0.5 mg by mouth daily.    [provider]  clonazePAM (KLONOPIN) 0.5 MG tablet Take 1 tablet (0.5 mg total) by mouth 3 (three) times daily as needed for anxiety. 04/21/14   Medina-Vargas, Monina C, NP  divalproex (DEPAKOTE) 250 MG DR tablet Take 750 mg by mouth 2 (two) times daily. Take 3 tablets by mouth  daily  to equal 750 mg.    [provider]  finasteride (PROSCAR) 5 MG tablet Take 5 mg by mouth daily.    [provider]  fluPHENAZine (PROLIXIN) 10 MG tablet Take 10 mg by mouth 2 (two) times daily.    [provider]  furosemide (LASIX) 20 MG tablet Take 20 mg by mouth 3 (three) times daily.    [provider]  Iloperidone (FANAPT) 12 MG  TABS Take 12 mg by mouth 2 (two) times daily.    [provider]  ipratropium-albuterol (DUONEB) 0.5-2.5 (3) MG/3ML SOLN Take 3 mLs by nebulization as needed. For wheezing.    [provider]  levothyroxine (SYNTHROID, LEVOTHROID) 75 MCG tablet Take 37.5 mcg by mouth daily.    [provider]  olanzapine zydis (ZYPREXA) 15 MG disintegrating tablet Take 15 mg by mouth at bedtime.    [provider]  oxybutynin (DITROPAN) 5 MG tablet Take 5 mg  by mouth daily.    [provider]  phenytoin (DILANTIN) 100 MG ER capsule Take 100 mg by mouth 2 (two) times daily.    [provider]  potassium chloride (K-DUR,KLOR-CON) 10 MEQ tablet Take 10 mEq by mouth daily.    [provider]  Tamsulosin HCl (FLOMAX) 0.4 MG CAPS Take 0.4 mg by mouth daily.    [provider]  topiramate (TOPAMAX) 100 MG tablet Take 100 mg by mouth daily.    [provider]    Allergies    Coumadin [warfarin sodium] and Lithium  Review of Systems   Review of Systems  Unable to perform ROS: Dementia    Physical Exam Updated Vital Signs BP (!) 108/58 (BP Location: Left Arm)   Pulse (!) 102   Temp 98.8 F (37.1 C) (Oral)   Resp 18   SpO2 93%   Physical Exam Vitals and nursing note reviewed.  Constitutional:      General: He is not in acute distress.    Appearance: He is well-developed. He is not diaphoretic.  HENT:     Head: Normocephalic and atraumatic.  Cardiovascular:     Rate and Rhythm: Normal rate and regular rhythm.     Heart sounds: No murmur. No friction rub.  Pulmonary:     Effort: Pulmonary effort is normal. No respiratory distress.     Breath sounds: Normal breath sounds. No wheezing or rales.  Abdominal:     General: Bowel sounds are normal. There is no distension.     Palpations: Abdomen is soft.     Tenderness: There is no abdominal tenderness.  Musculoskeletal:        General: Normal range of motion.     Cervical back: Normal range of motion and neck supple.     Comments: There is tenderness to palpation of the distal right femur.  There does appear to be some swelling in this area.  DP pulses are palpable, but remainder of exam difficult secondary to pain/dementia.  Skin:    General: Skin is warm and dry.  Neurological:     Mental Status: He is alert and oriented to person, place, and time.     Coordination: Coordination normal.     ED Results / Procedures / Treatments   Labs (all  labs ordered are listed, but only abnormal results are displayed) Labs Reviewed  BASIC METABOLIC PANEL  CBC WITH DIFFERENTIAL/PLATELET  PROTIME-INR    EKG None  Radiology No results found.  Procedures Procedures (including critical care time)  Medications Ordered in ED Medications - No data to display  ED Course  I have reviewed the triage vital signs and the nursing notes.  Pertinent labs & imaging results that were available during my care of the patient were reviewed by me and considered in my medical decision making (see chart for details).    MDM Rules/Calculators/A&P  Patient sent here for evaluation after his x-rays at his extended care facility revealed a  distal femur fracture following a fall yesterday.  X-rays repeated here showing a nondisplaced spiral fracture of the distal femur.  This finding was discussed with Dr. Victorino Dike from orthopedic surgery who is also evaluated the patient.  Due to the patient's nonambulatory status and overall clinical picture, he does not feel as though surgical intervention is indicated.  Patient will be placed in a splint/knee immobilizer and is to follow-up with Dr. Victorino Dike in 1 month.  I did contact the patient's next of kin, Carliss Quast his brother.  I notified him of the diagnosis and treatment plan and he is in agreement with this.  Final Clinical Impression(s) / ED Diagnoses Final diagnoses:  None    Rx / DC Orders ED Discharge Orders    None       Geoffery Lyons, MD 12/05/19 2159    Geoffery Lyons, MD 12/05/19 2206

## 2019-12-05 NOTE — Consult Note (Signed)
Reason for Consult:  Right knee pain Referring Physician:  Dr. Queen Slough is an 74 y.o. male.  HPI: The patient is a 74 year old male with a past medical history significant for bipolar disorder and schizophrenia.  He is a resident of a skilled nursing facility and by report does not ambulate.  He fell out of bed yesterday and today complains of right lower extremity pain.  He was sent to the emergency room for treatment.  I am consulted for evaluation and treatment of this injury.  He denies any history of injury or surgery around that knee.  Past Medical History:  Diagnosis Date  . Asthma   . Bipolar disorder, unspecified ([place])   . Chronic airway obstruction, not elsewhere classified   . Deep vein thrombophlebitis of leg, unspecified laterality ([place])   . Hereditary edema of legs   . Muscle weakness (generalized)   . Nonspecific abnormal toxicological findings   . Schizoaffective disorder, unspecified condition   . Unspecified epilepsy without mention of intractable epilepsy   . Unspecified essential hypertension   . Unspecified hypothyroidism     Past Surgical History:  Procedure Laterality Date  . none      History reviewed. No pertinent family history.  Social History:  reports that he has never smoked. He has never used smokeless tobacco. He reports that he does not drink alcohol. No history on file for drug.  Allergies:  Allergies  Allergen Reactions  . Coumadin [Warfarin Sodium]   . Lithium     Medications: I have reviewed the patient's current medications.  Results for orders placed or performed during the hospital encounter of 12/05/19 (from the past 48 hour(s))  CBC with Differential     Status: Abnormal   Collection Time: 12/05/19  7:55 PM  Result Value Ref Range   WBC 9.1 4.0 - 10.5 K/uL   RBC 5.91 (H) 4.22 - 5.81 MIL/uL   Hemoglobin 14.8 13.0 - 17.0 g/dL   HCT 48.3 39.0 - 52.0 %   MCV 81.7 80.0 - 100.0 fL   MCH 25.0 (L) 26.0 - 34.0 pg   MCHC  30.6 30.0 - 36.0 g/dL   RDW 17.1 (H) 11.5 - 15.5 %   Platelets 438 (H) 150 - 400 K/uL   nRBC 0.0 0.0 - 0.2 %   Neutrophils Relative % 69 %   Neutro Abs 6.2 1.7 - 7.7 K/uL   Lymphocytes Relative 14 %   Lymphs Abs 1.2 0.7 - 4.0 K/uL   Monocytes Relative 13 %   Monocytes Absolute 1.2 (H) 0.1 - 1.0 K/uL   Eosinophils Relative 1 %   Eosinophils Absolute 0.1 0.0 - 0.5 K/uL   Basophils Relative 1 %   Basophils Absolute 0.1 0.0 - 0.1 K/uL   Immature Granulocytes 2 %   Abs Immature Granulocytes 0.19 (H) 0.00 - 0.07 K/uL    Comment: Performed at [facility], [place] [address] [place], [place] [address]  Protime-INR     Status: None   Collection Time: 12/05/19  7:55 PM  Result Value Ref Range   Prothrombin Time 13.6 11.4 - 15.2 seconds   INR 1.1 0.8 - 1.2    Comment: (NOTE) INR goal varies based on device and disease states. Performed at [facility], [place] [address], [place], [place] [address]     DG Chest 1 View  Result Date: 12/05/2019 CLINICAL DATA:  74 year old male status post fall from bed yesterday with right lower extremity  pain and evidence of a distal femur fracture on outside imaging. EXAM: CHEST  1 VIEW COMPARISON:  Portable chest 04/25/2010 and earlier. FINDINGS: Portable AP supine view at 1904 hours. Scoliosis. Lower lung volumes. Mediastinal contours remain within normal limits. Visualized tracheal air column is within normal limits. Increased elevation of the right hemidiaphragm with adjacent patchy opacity. No confluent opacity on the left. No evidence of pneumothorax, pulmonary edema or pleural effusion. No acute osseous abnormality identified. IMPRESSION: Lower lung volumes with right lung base opacity favored to be atelectasis. Electronically Signed   By: Odessa Fleming M.D.   On: 12/05/2019 19:35   DG Tibia/Fibula Right  Result Date: 12/05/2019 CLINICAL DATA:  74 year old male status post fall from bed yesterday with right lower extremity pain  and evidence of a distal femur fracture on outside imaging. EXAM: RIGHT TIBIA AND FIBULA - 2 VIEW COMPARISON:  Right femur series today. FINDINGS: Osteopenia and soft tissue swelling in the distal right lower extremity limited bone detail. The right tibia and fibula appear grossly intact. Occasional vascular phleboliths. IMPRESSION: No right tib-fib fracture identified. Osteopenia and soft tissue swelling. Electronically Signed   By: Odessa Fleming M.D.   On: 12/05/2019 19:34   DG Femur Min 2 Views Right  Result Date: 12/05/2019 CLINICAL DATA:  74 year old male status post fall from bed yesterday with right lower extremity pain and evidence of a distal femur fracture on outside imaging. EXAM: RIGHT FEMUR 2 VIEWS COMPARISON:  None. FINDINGS: Right femoral head appears normally located in the proximal right femur appears intact. Grossly intact visible right hemipelvis. In the distal right femoral shaft there is a linear spiral fracture extending to the lateral femoral condyle with minimal displacement. Cross-table lateral view of the right knee raises the possibility of a right knee joint effusion. IMPRESSION: 1. Minimally displaced spiral fracture of the distal right femoral shaft extending to the lateral femoral condyle. 2. Possible right knee joint effusion. Electronically Signed   By: Odessa Fleming M.D.   On: 12/05/2019 19:33    ROS: No recent fever, chills, nausea, vomiting or changes in his appetite PE:  Blood pressure 106/70, pulse (!) 109, temperature 98.8 F (37.1 C), temperature source Oral, resp. rate 20, SpO2 93 %. Well-nourished well-developed elderly male in no apparent distress.  Alert.  Oriented to person.  Extraocular motions are intact.  Respirations are unlabored.  Patient is seen lying supine on a stretcher in the emergency room.  The right lower extremity has significant lymphedema with some superimposed acute swelling at the knee.  There is lichenification of the anterior leg.  The left lower  extremity has lymphedema and lichenification as well.  Intact sensibility to light touch of the right lower extremity.  No strength in plantarflexion and dorsiflexion of the right ankle which is baseline.  Brisk capillary refill at the toes.  Assessment/Plan: Right distal femur nondisplaced fracture -I explained to the patient the nature of this injury.  Since the fracture is nondisplaced and he is nonambulatory I believe this can be treated safely in closed fashion.  We will put him in a knee immobilizer which should be maintained when he is in bed and at all times except for bathing.  Follow-up with me in the office in a month for x-rays.  He understands this plan and agrees.  I have spoken with Dr. Judd Lien and gone over the plan as well.  Toni Arthurs 12/05/2019, 8:33 PM

## 2019-12-05 NOTE — ED Triage Notes (Signed)
EMS reports from [facility], rolled out of bed yesterday, staff states this morning Pt c/o right leg pain, mobile Xray on site showed right distal femur Fx. Pt non verbal, Hx of dementia.  BP 132/72 HR 80 RR 18 Sp02 96 RA Temp 98.1

## 2020-01-17 ENCOUNTER — Other Ambulatory Visit: Payer: Self-pay

## 2020-01-17 ENCOUNTER — Non-Acute Institutional Stay: Payer: Medicare Other | Admitting: Hospice

## 2020-01-17 DIAGNOSIS — F0391 Unspecified dementia with behavioral disturbance: Secondary | ICD-10-CM

## 2020-01-17 DIAGNOSIS — Z515 Encounter for palliative care: Secondary | ICD-10-CM

## 2020-01-17 NOTE — Progress Notes (Signed)
Therapist, nutritional Palliative Care Consult Note Telephone: [phone]  Fax: [phone]  PATIENT NAME: Kristopher Pitts DOB: [birth year] MRN: [id number]  PRIMARY CARE PROVIDER:   Renford Dills, MD  REFERRING PROVIDER:  Renford Dills, MD [address] [place],  [place] [address]  RESPONSIBLE PARTY:   Extended Emergency Contact Information Primary Emergency Contact: [name],[name]  [place] Home Phone: [phone] Relation: Brother      RECOMMENDATIONS/PLAN:    Advance Care Planning/Goals of Care: Visit consisted of building trust and follow up on palliative care. He is currently a DNR, DNR and MOST form in the facility chart; uploaded DNR and MOST forms to Epic today.  Symptom management: Ongoing swelling to bilateral lower extremities related to hereditary lymphedema, more on the left leg. Spoke with lisa wound nurse who said brace to right leg is helpful. Encouraged elevation. Continues to be followed by Psych for schizoaffective disorder; he continues on Congentin and Deparkote. No recent seizures; continues on Dilantin. Patient with cognitive deficits; memory loss is at baseline r/t Dementia. FAST 6d, incontinent of bowel and bladder. He is total care, transfers with hoyer lift, Patient compliant with his medications. Encouraged ongoing nursing care. Follow up: Palliative care will continue to follow patient for goals of care clarification and symptom management. I spent 55 minutes providing this consultation; time includes chart review and documentation.   More than 50% of the time in this consultation was spent on coordinating communication    HISTORY OF PRESENT ILLNESS:  Kristopher Pitts is a 74 y.o. year old male with multiple medical problems including seizure disorder, Hereditary Lymphadema, Dementia, schizoaffective disorder, aspiration, frequent pneumonia. Palliative Care was asked to help address goals of care.     CODE  STATUS: DNR   PPS: 30% HOSPICE ELIGIBILITY/DIAGNOSIS: TBD  PAST MEDICAL HISTORY:  Past Medical History:  Diagnosis Date  . Asthma   . Bipolar disorder, unspecified (HCC)   . Chronic airway obstruction, not elsewhere classified   . Deep vein thrombophlebitis of leg, unspecified laterality (HCC)   . Hereditary edema of legs   . Muscle weakness (generalized)   . Nonspecific abnormal toxicological findings   . Schizoaffective disorder, unspecified condition   . Unspecified epilepsy without mention of intractable epilepsy   . Unspecified essential hypertension   . Unspecified hypothyroidism     SOCIAL HX:  Social History   Tobacco Use  . Smoking status: Never Smoker  . Smokeless tobacco: Never Used  Substance Use Topics  . Alcohol use: No    ALLERGIES:  Allergies  Allergen Reactions  . Coumadin [Warfarin Sodium]   . Lithium      PERTINENT MEDICATIONS:  Outpatient Encounter Medications as of 01/17/2020  Medication Sig  . albuterol (VENTOLIN HFA) 108 (90 Base) MCG/ACT inhaler Inhale 2 puffs into the lungs every 6 (six) hours as needed for wheezing or shortness of breath.  . AMBULATORY NON FORMULARY MEDICATION Medication Name: Lorazepam 1 mg /ML gel, apply 1 ML topically every 8 hours as needed (Patient not taking: Reported on 12/05/2019)  . benztropine (COGENTIN) 0.5 MG tablet Take 0.5 mg by mouth 2 (two) times daily.   . cholecalciferol (VITAMIN D3) 25 MCG (1000 UNIT) tablet Take 1,000 Units by mouth daily.  . clonazePAM (KLONOPIN) 0.5 MG tablet Take 1 tablet (0.5 mg total) by mouth 3 (three) times daily as needed for anxiety. (Patient not taking: Reported on 12/05/2019)  . divalproex (DEPAKOTE SPRINKLE) 125 MG capsule Take 750  mg by mouth 2 (two) times daily.  . ferrous sulfate 325 (65 FE) MG EC tablet Take 325 mg by mouth daily with breakfast.   . finasteride (PROSCAR) 5 MG tablet Take 5 mg by mouth daily.  . fluPHENAZine (PROLIXIN) 5 MG tablet Take 5-10 mg by mouth in the  morning and at bedtime. Take 1 tablet in the am and Take 2 tablets in the pm  . furosemide (LASIX) 20 MG tablet Take 60 mg by mouth 2 (two) times daily.   Marland Kitchen guaifenesin (ROBITUSSIN) 100 MG/5ML syrup Take 200 mg by mouth 4 (four) times daily as needed for cough.  . Iloperidone (FANAPT) 12 MG TABS Take 12 mg by mouth 2 (two) times daily.  Marland Kitchen levothyroxine (SYNTHROID, LEVOTHROID) 75 MCG tablet Take 37.5 mcg by mouth daily.  Marland Kitchen loratadine (CLARITIN) 10 MG tablet Take 10 mg by mouth daily.  Marland Kitchen omeprazole (PRILOSEC) 20 MG capsule Take 20 mg by mouth daily.  . phenytoin (DILANTIN) 100 MG ER capsule Take 200 mg by mouth 2 (two) times daily.   . potassium chloride (K-DUR,KLOR-CON) 10 MEQ tablet Take 10 mEq by mouth daily.  Marland Kitchen Propylene Glycol (SYSTANE BALANCE) 0.6 % SOLN Apply 2 drops to eye daily as needed (dry eyes).  . sennosides-docusate sodium (SENOKOT-S) 8.6-50 MG tablet Take 1 tablet by mouth in the morning and at bedtime.  . Tamsulosin HCl (FLOMAX) 0.4 MG CAPS Take 0.4 mg by mouth daily.  Marland Kitchen topiramate (TOPAMAX) 100 MG tablet Take 100 mg by mouth 2 (two) times daily.   Marland Kitchen umeclidinium bromide (INCRUSE ELLIPTA) 62.5 MCG/INH AEPB Inhale 1 puff into the lungs daily.   No facility-administered encounter medications on file as of 01/17/2020.    PHYSICAL EXAM/ROS:   General: NAD, cooperative Cardiovascular: regular rate and rhythm, denies chest pain Pulmonary: clear ant fields, normal respiratory effort Abdomen: soft, nontender, + bowel sounds Extremities: bilateral lymphadema, more on the left leg; brace to right leg, wound nurse following Skin: no rashes to exposed skin Neurological: Weakness but otherwise nonfocal  Teodoro Spray, NP

## 2020-05-30 DEATH — deceased

## 2021-01-28 IMAGING — RF DG SWALLOWING FUNCTION
16 series · 24 of 24 positions shown · non-contrast
Comparison: 06/06/2015

CLINICAL DATA: Dysphagia.  History of schizoaffective disorder.

EXAM:
MODIFIED BARIUM SWALLOW
TECHNIQUE: Different consistencies of barium were administered orally to the
patient by the Speech Pathologist. Imaging of the pharynx was
performed in the lateral projection.
FLUOROSCOPY TIME:  Fluoroscopy Time:  2 minutes and 12 seconds
Radiation Exposure Index (if provided by the fluoroscopic device): 6
mGy
Number of Acquired Spot Images: 0

[Series 1: cp_standard · 0.34mm/px · 2 of 49 frames shown (1 of 16)]
[frame 8/49]
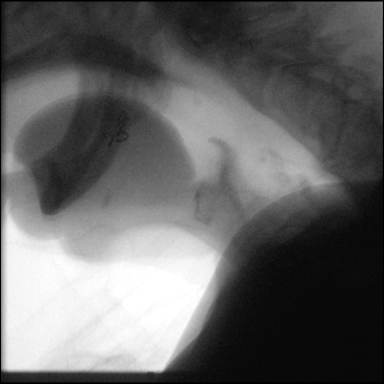
[frame 42/49]
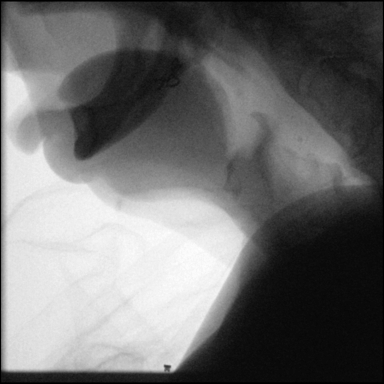

[Series 2: cp_standard · 0.34mm/px · 1 of 171 frames shown (2 of 16)]
[frame 26/171]
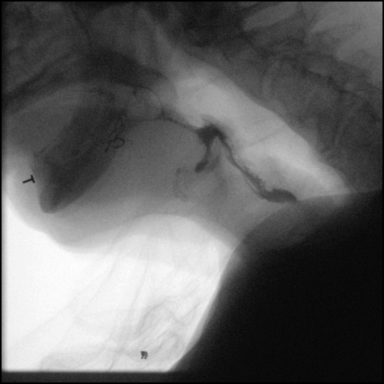

[Series 3: cp_standard · 0.34mm/px · 2 of 56 frames shown (3 of 16)]
[frame 9/56]
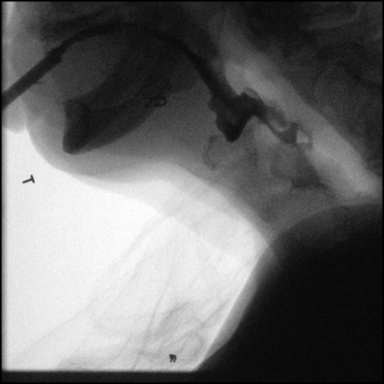
[frame 48/56]
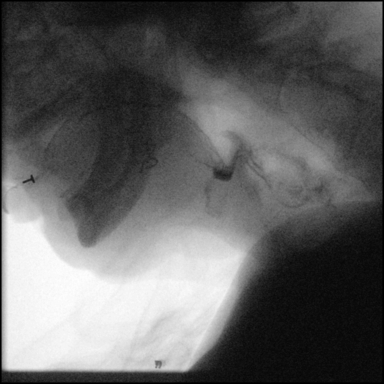

[Series 4: cp_standard · 0.34mm/px · 1 of 156 frames shown (4 of 16)]
[frame 24/156]
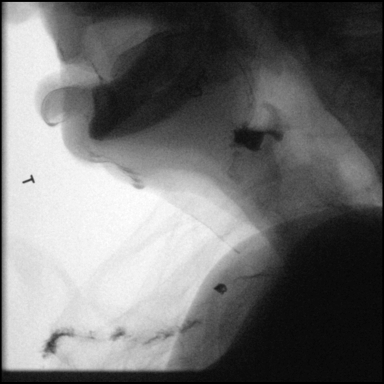

[Series 5: cp_standard · 0.34mm/px · 2 of 78 frames shown (5 of 16)]
[frame 12/78]
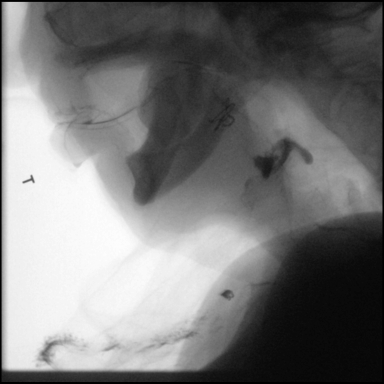
[frame 67/78]
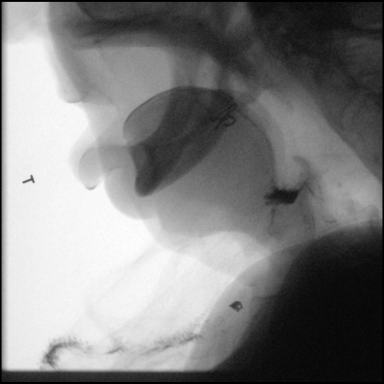

[Series 6: cp_standard · 0.34mm/px · 1 of 49 frames shown (6 of 16)]
[frame 25/49]
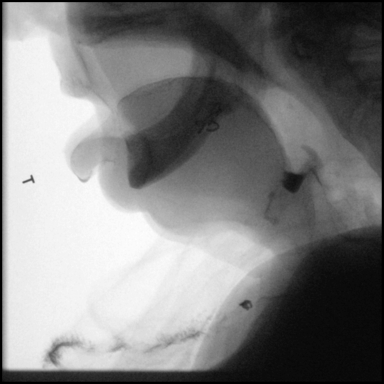

[Series 7: cp_standard · 0.34mm/px · 2 of 130 frames shown (7 of 16)]
[frame 18/130]
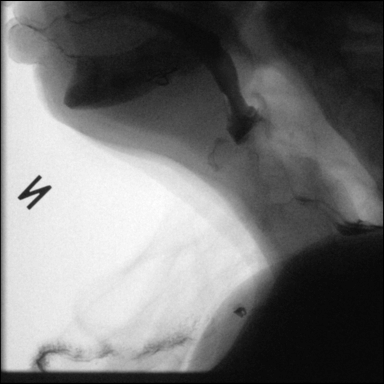
[frame 111/130]
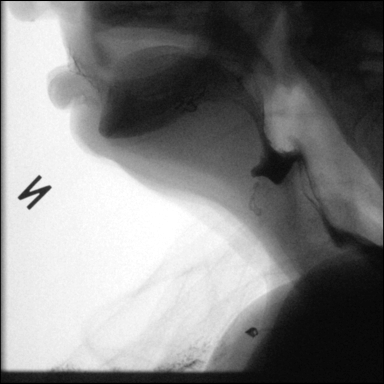

[Series 8: cp_standard · 0.34mm/px · 1 of 34 frames shown (8 of 16)]
[frame 29/34]
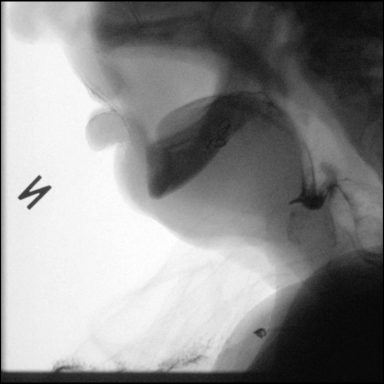

[Series 9: cp_standard · 0.35mm/px · 1 of 68 frames shown (9 of 16)]
[frame 14/68]
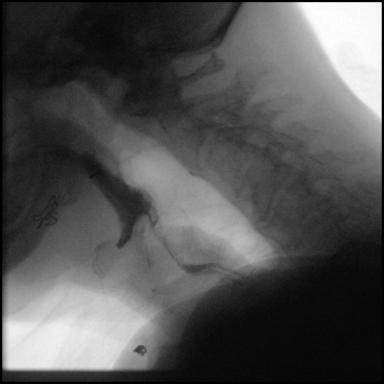

[Series 10: cp_standard · 0.35mm/px · 2 of 219 frames shown (10 of 16)]
[frame 9/219]
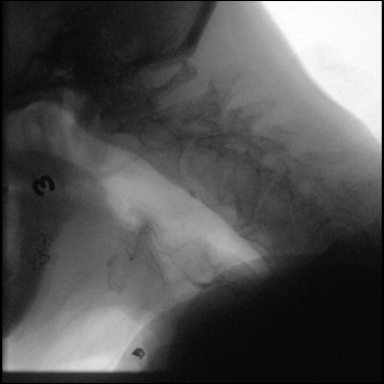
[frame 187/219]
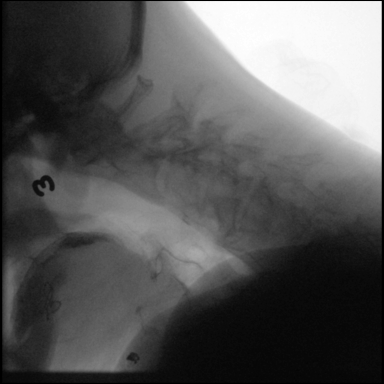

[Series 11: cp_standard · 0.35mm/px · 1 of 32 frames shown (11 of 16)]
[frame 17/32]
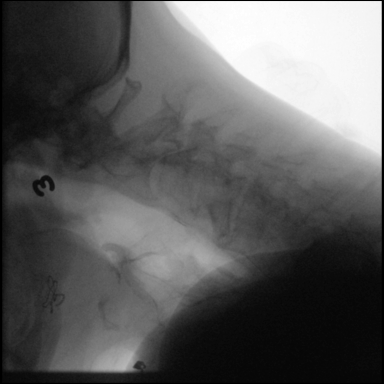

[Series 12: cp_standard · 0.35mm/px · 2 of 71 frames shown (12 of 16)]
[frame 3/71]
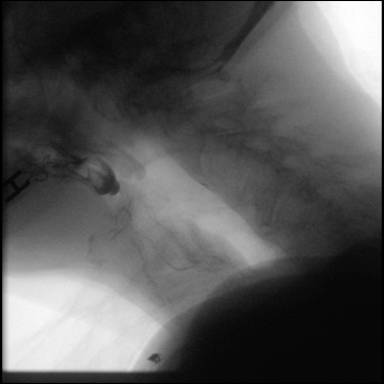
[frame 61/71]
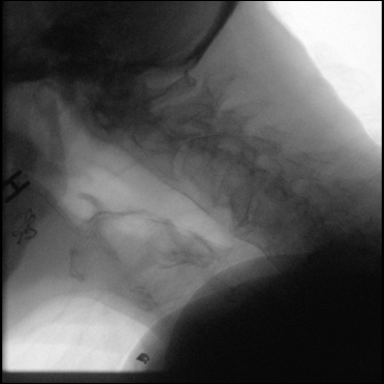

[Series 13: cp_standard · 0.35mm/px · 1 of 65 frames shown (13 of 16)]
[frame 33/65]
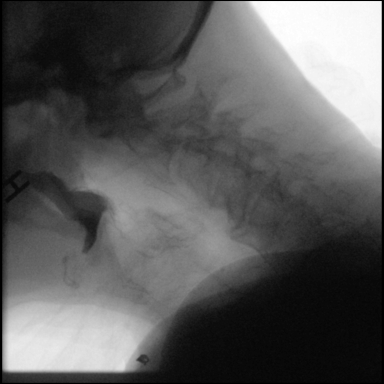

[Series 14: cp_standard · 0.35mm/px · 2 of 133 frames shown (14 of 16)]
[frame 20/133]
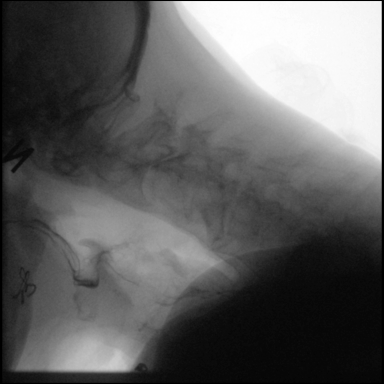
[frame 118/133]
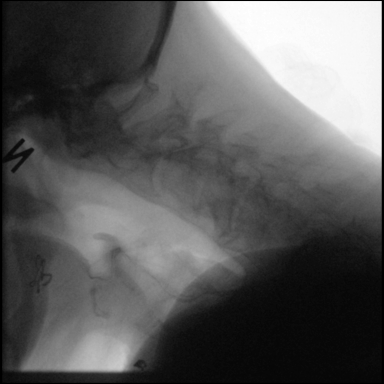

[Series 15: cp_standard · 0.35mm/px · 1 of 69 frames shown (15 of 16)]
[frame 59/69]
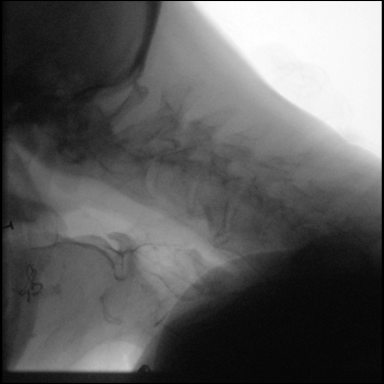

[Series 16: cp_standard · 0.35mm/px · 2 of 77 frames shown (16 of 16)]
[frame 12/77]
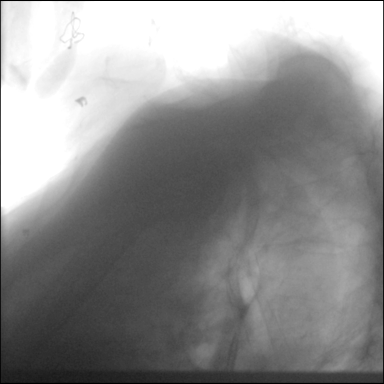
[frame 66/77]
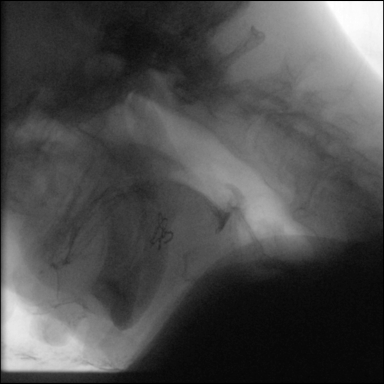

[24 of 24 positions shown; findings below may reference images not displayed]

FINDINGS: Thin liquid-with individual swallows, premature spill and delayed
swallow trigger. With swallows from a straw, additional gross,
silent penetration.

Nectar thick liquid-premature spill and delayed swallow trigger

Honey-delayed swallow trigger

Pure-delayed swallow trigger

Liquid with cracker-delayed swallow trigger
IMPRESSION: Swallow dysfunction, as above.

Please refer to the Speech Pathologists report for complete details
and recommendations.
